# Patient Record
Sex: Female | Born: 1980
Health system: Southern US, Community
[De-identification: ages and names within clinical notes are randomized; demographics above are authoritative.]

## PROBLEM LIST (undated history)

## (undated) DIAGNOSIS — F329 Major depressive disorder, single episode, unspecified: Secondary | ICD-10-CM

## (undated) DIAGNOSIS — F32A Depression, unspecified: Secondary | ICD-10-CM

## (undated) DIAGNOSIS — E119 Type 2 diabetes mellitus without complications: Secondary | ICD-10-CM

## (undated) DIAGNOSIS — F419 Anxiety disorder, unspecified: Secondary | ICD-10-CM

## (undated) DIAGNOSIS — E1169 Type 2 diabetes mellitus with other specified complication: Secondary | ICD-10-CM

## (undated) DIAGNOSIS — D649 Anemia, unspecified: Secondary | ICD-10-CM

## (undated) DIAGNOSIS — E049 Nontoxic goiter, unspecified: Secondary | ICD-10-CM

## (undated) DIAGNOSIS — G629 Polyneuropathy, unspecified: Secondary | ICD-10-CM

## (undated) HISTORY — DX: Type 2 diabetes mellitus with other specified complication: E11.69

## (undated) HISTORY — DX: Anemia, unspecified: D64.9

## (undated) HISTORY — DX: Depression, unspecified: F32.A

## (undated) HISTORY — PX: SALPINGECTOMY: SHX328

## (undated) HISTORY — DX: Major depressive disorder, single episode, unspecified: F32.9

## (undated) HISTORY — DX: Anxiety disorder, unspecified: F41.9

## (undated) HISTORY — PX: OTHER SURGICAL HISTORY: SHX169

## (undated) HISTORY — DX: Type 2 diabetes mellitus without complications: E11.9

## (undated) HISTORY — DX: Polyneuropathy, unspecified: G62.9

## (undated) HISTORY — PX: INNER EAR SURGERY: SHX679

---

## 1999-05-27 ENCOUNTER — Other Ambulatory Visit: Admission: RE | Admit: 1999-05-27 | Discharge: 1999-05-27 | Payer: Self-pay | Admitting: Internal Medicine

## 1999-12-12 ENCOUNTER — Emergency Department (HOSPITAL_COMMUNITY): Admission: EM | Admit: 1999-12-12 | Discharge: 1999-12-12 | Payer: Self-pay | Admitting: Emergency Medicine

## 2001-01-22 ENCOUNTER — Emergency Department (HOSPITAL_COMMUNITY): Admission: EM | Admit: 2001-01-22 | Discharge: 2001-01-22 | Payer: Self-pay | Admitting: Emergency Medicine

## 2001-11-05 ENCOUNTER — Other Ambulatory Visit: Admission: RE | Admit: 2001-11-05 | Discharge: 2001-11-05 | Payer: Self-pay | Admitting: Gynecology

## 2003-09-29 ENCOUNTER — Other Ambulatory Visit: Admission: RE | Admit: 2003-09-29 | Discharge: 2003-09-29 | Payer: Self-pay | Admitting: Gynecology

## 2004-10-08 ENCOUNTER — Other Ambulatory Visit: Admission: RE | Admit: 2004-10-08 | Discharge: 2004-10-08 | Payer: Self-pay | Admitting: Gynecology

## 2009-01-02 ENCOUNTER — Emergency Department (HOSPITAL_COMMUNITY): Admission: EM | Admit: 2009-01-02 | Discharge: 2009-01-02 | Payer: Self-pay | Admitting: Emergency Medicine

## 2009-01-15 ENCOUNTER — Inpatient Hospital Stay (HOSPITAL_COMMUNITY): Admission: AD | Admit: 2009-01-15 | Discharge: 2009-01-15 | Payer: Self-pay | Admitting: Family Medicine

## 2009-12-03 ENCOUNTER — Inpatient Hospital Stay (HOSPITAL_COMMUNITY): Admission: RE | Admit: 2009-12-03 | Discharge: 2009-12-03 | Payer: Self-pay | Admitting: Obstetrics & Gynecology

## 2009-12-03 ENCOUNTER — Ambulatory Visit: Payer: Self-pay | Admitting: Obstetrics and Gynecology

## 2009-12-21 ENCOUNTER — Inpatient Hospital Stay (HOSPITAL_COMMUNITY): Admission: AD | Admit: 2009-12-21 | Discharge: 2009-12-22 | Payer: Self-pay | Admitting: Obstetrics & Gynecology

## 2009-12-21 ENCOUNTER — Ambulatory Visit: Payer: Self-pay | Admitting: Nurse Practitioner

## 2010-01-02 ENCOUNTER — Other Ambulatory Visit: Admission: RE | Admit: 2010-01-02 | Discharge: 2010-01-02 | Payer: Self-pay | Admitting: Obstetrics and Gynecology

## 2010-01-02 ENCOUNTER — Ambulatory Visit: Payer: Self-pay | Admitting: Obstetrics and Gynecology

## 2010-01-03 ENCOUNTER — Encounter (INDEPENDENT_AMBULATORY_CARE_PROVIDER_SITE_OTHER): Payer: Self-pay | Admitting: *Deleted

## 2010-01-03 LAB — CONVERTED CEMR LAB: Hepatitis B Surface Ag: NEGATIVE

## 2010-01-16 ENCOUNTER — Ambulatory Visit: Payer: Self-pay | Admitting: Obstetrics and Gynecology

## 2010-02-06 ENCOUNTER — Ambulatory Visit: Payer: Self-pay | Admitting: Obstetrics & Gynecology

## 2010-02-06 LAB — CONVERTED CEMR LAB: TSH: 2.228 microintl units/mL (ref 0.350–4.500)

## 2010-02-27 ENCOUNTER — Inpatient Hospital Stay (HOSPITAL_COMMUNITY)
Admission: RE | Admit: 2010-02-27 | Discharge: 2010-03-01 | Payer: Self-pay | Source: Home / Self Care | Attending: Obstetrics & Gynecology | Admitting: Obstetrics & Gynecology

## 2010-03-20 ENCOUNTER — Ambulatory Visit
Admission: RE | Admit: 2010-03-20 | Discharge: 2010-03-20 | Payer: Self-pay | Source: Home / Self Care | Attending: Obstetrics and Gynecology | Admitting: Obstetrics and Gynecology

## 2010-04-04 ENCOUNTER — Ambulatory Visit
Admission: RE | Admit: 2010-04-04 | Discharge: 2010-04-04 | Payer: Self-pay | Source: Home / Self Care | Attending: Obstetrics & Gynecology | Admitting: Obstetrics & Gynecology

## 2010-05-20 LAB — CBC
Hemoglobin: 8.1 g/dL — ABNORMAL LOW (ref 12.0–15.0)
MCH: 21.9 pg — ABNORMAL LOW (ref 26.0–34.0)
MCV: 69.7 fL — ABNORMAL LOW (ref 78.0–100.0)
RBC: 3.7 MIL/uL — ABNORMAL LOW (ref 3.87–5.11)
WBC: 9.6 10*3/uL (ref 4.0–10.5)

## 2010-05-21 LAB — CBC
Hemoglobin: 10 g/dL — ABNORMAL LOW (ref 12.0–15.0)
MCH: 23.3 pg — ABNORMAL LOW (ref 26.0–34.0)
RBC: 4.27 MIL/uL (ref 3.87–5.11)
WBC: 5.6 10*3/uL (ref 4.0–10.5)

## 2010-05-21 LAB — SURGICAL PCR SCREEN: Staphylococcus aureus: NEGATIVE

## 2010-05-22 LAB — POCT PREGNANCY, URINE: Preg Test, Ur: NEGATIVE

## 2010-05-23 LAB — URINALYSIS, ROUTINE W REFLEX MICROSCOPIC
Glucose, UA: NEGATIVE mg/dL
Ketones, ur: NEGATIVE mg/dL
Leukocytes, UA: NEGATIVE
Nitrite: NEGATIVE
Specific Gravity, Urine: >1.03 — ABNORMAL HIGH (ref 1.005–1.030)
pH: 5.5 (ref 5.0–8.0)

## 2010-05-23 LAB — CBC
HCT: 31.7 % — ABNORMAL LOW (ref 36.0–46.0)
Hemoglobin: 9.9 g/dL — ABNORMAL LOW (ref 12.0–15.0)
Hemoglobin: 10.7 g/dL — ABNORMAL LOW (ref 12.0–15.0)
MCH: 27.8 pg (ref 26.0–34.0)
MCV: 85.4 fL (ref 78.0–100.0)
Platelets: 252 10*3/uL (ref 150–400)
RBC: 3.58 MIL/uL — ABNORMAL LOW (ref 3.87–5.11)
RBC: 3.71 MIL/uL — ABNORMAL LOW (ref 3.87–5.11)
RDW: 15.9 % — ABNORMAL HIGH (ref 11.5–15.5)
WBC: 6.8 10*3/uL (ref 4.0–10.5)
WBC: 7.2 10*3/uL (ref 4.0–10.5)

## 2010-05-23 LAB — URINE MICROSCOPIC-ADD ON

## 2010-05-23 LAB — POCT PREGNANCY, URINE: Preg Test, Ur: NEGATIVE

## 2010-05-23 LAB — GC/CHLAMYDIA PROBE AMP, GENITAL: Chlamydia, DNA Probe: NEGATIVE

## 2010-05-24 NOTE — Progress Notes (Signed)
Wanda [name], BLUMBERG          ACCOUNT NO.:  [id number]  MEDICAL RECORD NO.:  [id number]           PATIENT TYPE:  LOCATION:  [facility]                     FACILITY:  PHYSICIAN:  Allie Bossier, MD        DATE OF BIRTH:  [birth year]  DATE OF SERVICE:  04/04/2010                                 CLINIC NOTE  [name] is a 30 year old lady who is now 4-1/2 week status post exploratory laparotomy and right salpingo-oophorectomy for right hydrosalpinx and a 9 cm right dermoid.  The pathology has come back benign dermoid and hydrosalpinx was noted.  On exam, the patient has no complaints.  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  She has not yet had intercourse as she is currently abstinent.  Her bowel and bladder habits have returned to normal.  On exam, her incision is very well-healed and her abdomen is benign.  ASSESSMENT AND PLAN:  Postop doing well.  She will come back in November for her Pap smears and annual exam or sooner as necessary.     Allie Bossier, MD    MCD/MEDQ  D:  04/04/2010  T:  04/05/2010  Job:  [id number]

## 2010-06-06 ENCOUNTER — Ambulatory Visit: Payer: Self-pay

## 2010-06-06 DIAGNOSIS — N938 Other specified abnormal uterine and vaginal bleeding: Secondary | ICD-10-CM

## 2010-06-06 DIAGNOSIS — N949 Unspecified condition associated with female genital organs and menstrual cycle: Secondary | ICD-10-CM

## 2010-06-12 LAB — URINALYSIS, ROUTINE W REFLEX MICROSCOPIC
Bilirubin Urine: NEGATIVE
Nitrite: NEGATIVE
Specific Gravity, Urine: 1.02 (ref 1.005–1.030)
Urobilinogen, UA: 0.2 mg/dL (ref 0.0–1.0)
pH: 5 (ref 5.0–8.0)

## 2010-06-12 LAB — POCT PREGNANCY, URINE: Preg Test, Ur: NEGATIVE

## 2010-06-12 LAB — CBC
Hemoglobin: 12 g/dL (ref 12.0–15.0)
MCHC: 32.7 g/dL (ref 30.0–36.0)
RBC: 4.45 MIL/uL (ref 3.87–5.11)
RDW: 17.2 % — ABNORMAL HIGH (ref 11.5–15.5)

## 2010-06-12 LAB — URINE MICROSCOPIC-ADD ON

## 2010-06-13 LAB — MONONUCLEOSIS SCREEN: Mono Screen: NEGATIVE

## 2010-06-13 LAB — RAPID STREP SCREEN (MED CTR MEBANE ONLY): Streptococcus, Group A Screen (Direct): NEGATIVE

## 2010-08-07 ENCOUNTER — Other Ambulatory Visit: Payer: Self-pay | Admitting: Family Medicine

## 2010-08-07 ENCOUNTER — Ambulatory Visit (INDEPENDENT_AMBULATORY_CARE_PROVIDER_SITE_OTHER): Payer: Self-pay | Admitting: Family Medicine

## 2010-08-07 DIAGNOSIS — N899 Noninflammatory disorder of vagina, unspecified: Secondary | ICD-10-CM

## 2010-08-07 DIAGNOSIS — D219 Benign neoplasm of connective and other soft tissue, unspecified: Secondary | ICD-10-CM

## 2010-08-07 DIAGNOSIS — D259 Leiomyoma of uterus, unspecified: Secondary | ICD-10-CM

## 2010-08-07 DIAGNOSIS — R81 Glycosuria: Secondary | ICD-10-CM

## 2010-08-07 LAB — POCT URINALYSIS DIP (DEVICE)
Glucose, UA: >=1000 mg/dL — AB
Protein, ur: NEGATIVE mg/dL
Urobilinogen, UA: 1 mg/dL (ref 0.0–1.0)

## 2010-08-08 NOTE — Group Therapy Note (Signed)
Wanda [name], RIESER          ACCOUNT NO.:  [id number]  MEDICAL RECORD NO.:  [id number]           PATIENT TYPE:  A  LOCATION:  [facility]                   FACILITY:  [facility]  PHYSICIAN:  Lucina Mellow, DO   DATE OF BIRTH:  1980-12-12  DATE OF SERVICE:  08/07/2010                                 CLINIC NOTE  PRESENTING COMPLAINT:  Followup.  HISTORY OF PRESENT ILLNESS:  The patient is a 30 year old gravida 0 who presents for followup after having a 9-cm right dermoid cyst removed in December 2011.  The patient states she is doing well.  She has been on Depo-Provera since the surgery and states that about the week or so before her injection is due, she starts to have a little bit of bleeding.  She states the bleeding is nowhere near as heavy as it was prior to the surgery, but she is currently getting a normal period.  She states that she does not soak her pads and does not feel like she has to wear tampons and pads, and has a very minimal if any pain at all.  She does, however, also admits today that she is having some vaginal irritation especially when she urinates.  She states that she was using [place] Spring soap __________ that [place] would be better, and she has been cleansing her area with that as well as using Vagisil with some relief of her symptoms.  She would like to do urinalysis today as well as a pelvic exam to rule out infection.  Past surgical history includes laparotomy with right dermoid cyst removal and lysis of adhesions in December 2011 and repair of her right eardrum.  MEDICATIONS:  Depo-Provera.  MEDICAL HISTORY: 1. Obesity. 2. Hypertension. 3. Fibroid and dermoid cyst. 4. Dysfunctional uterine bleeding.  PHYSICAL EXAMINATION:  VITAL SIGNS:  Today, the patient's blood pressure is 137/89, pulse of 86, temperature of 98.4, weight of 195.6, height of 62.5 inches. GENERAL:  The patient is a pleasant African American female who looks her stated age of 30  years old. HEART:  Regular rate and rhythm. LUNGS:  Clear to auscultation bilaterally.  Diarrhea is nonpalpable. PELVIC:  Externally, she has normal-appearing external female genitalia with some mild erythema on the lips of the labia internal.  Speculum exam shows nice pink vaginal mucosa with appropriate amount of moisture and rugae.  Bleeding is noted in the vaginal vault coming from the cervical os.  Gonorrhea, Chlamydia, DNA swab was obtained as well as a wet prep.  Bimanual exam reveals no masses on the right.  Left ovary is palpated and is small, benign, and uterus feels about to be 12-weeks size.  ASSESSMENT: 1. Uterine fibroids.  We will get a followup ultrasound to evaluate     this as a possible cause of her continued bleeding despite Depo-     Provera; and if it is enlarging, consider other options for the     patient.  The patient desires to have a child and the fibroid may     be anatomically preventing this, but she is also on Depo-Provera at     this time for her bleeding.  The ultrasound will  also serve as a     followup on the right hydrosalpinx, which was surgically removed. 2. External vaginitis, seems to be contact dermatitis.  We discussed     using a much milder soap such as Caress or Dove instead of [place] or     even just warm water for at least a few days until the irritation     and inflammation resolves a bit.  I will wait for the cultures to     come back and the wet prep to come back before I give her any kind     of treatment, but she will be notified of treatment. 3. Urinalysis shows glycosuria with greater than a 1000 glucose.  The     patient states she has no history of diabetes, but she has been     eating up some sugar and candies all day long.  We will obtain a     hemoglobin A1c, chemistry panel, and thyroid panel today on her to     assess her for any diabetes and to start treatment on her as     indicated.  The patient overall will follow up after  ultrasound and     the laboratory results are back.  If anything is abnormal prior to     when I see her, I will give her a call.  The patient voices     understanding and agrees with plans.          ______________________________ Lucina Mellow, DO    SH/MEDQ  D:  08/07/2010  T:  08/08/2010  Job:  [id number]

## 2010-08-13 ENCOUNTER — Other Ambulatory Visit (HOSPITAL_COMMUNITY): Payer: Self-pay

## 2010-08-13 ENCOUNTER — Ambulatory Visit (HOSPITAL_COMMUNITY)
Admission: RE | Admit: 2010-08-13 | Discharge: 2010-08-13 | Disposition: A | Discharge status: Home / Self Care | Payer: Self-pay | Source: Ambulatory Visit | Attending: Family Medicine | Admitting: Family Medicine

## 2010-08-13 DIAGNOSIS — D252 Subserosal leiomyoma of uterus: Secondary | ICD-10-CM | POA: Insufficient documentation

## 2010-08-13 DIAGNOSIS — N7013 Chronic salpingitis and oophoritis: Secondary | ICD-10-CM | POA: Insufficient documentation

## 2010-08-13 DIAGNOSIS — D219 Benign neoplasm of connective and other soft tissue, unspecified: Secondary | ICD-10-CM

## 2010-08-26 ENCOUNTER — Ambulatory Visit: Payer: Self-pay | Admitting: Family Medicine

## 2010-08-26 ENCOUNTER — Ambulatory Visit: Payer: Self-pay

## 2010-08-26 DIAGNOSIS — N7013 Chronic salpingitis and oophoritis: Secondary | ICD-10-CM

## 2010-08-26 DIAGNOSIS — E119 Type 2 diabetes mellitus without complications: Secondary | ICD-10-CM

## 2010-08-26 DIAGNOSIS — D259 Leiomyoma of uterus, unspecified: Secondary | ICD-10-CM

## 2010-08-27 NOTE — Group Therapy Note (Unsigned)
Wanda [name], Wanda [name]          ACCOUNT NO.:  [id number]  MEDICAL RECORD NO.:  [id number]           PATIENT TYPE:  A  LOCATION[facility]  [facility]                   FACILITY:  [facility]  PHYSICIAN:  Lucina Mellow, DO   DATE OF BIRTH:  12/28/1980  DATE OF SERVICE:  08/26/2010                                 CLINIC NOTE  The patient presents today for results of her ultrasound and also for a Depo-Provera shot.  The patient states that she never took her medication for Trichomonas which is provided for her on January 02, 2010, had a wet prep on Aug 07, 2010, that was negative for Trichomonas. She is concerned because she continues to have brownish vaginal discharge and also continues to have irritation.  The patient was seen by me on Aug 07, 2010, who was presenting for followup after having a right centimeter dermoid cyst removed in 2011.  At that time, she states she was doing well, but was having some irritating vaginal discharge, at which time I performed STD testing which the information is shared with her today.  The patient also was having urinary symptoms and so urinalysis was done at that visit and was found that she had over a 1000 of glucose in her urine.  Further lab testing was ordered to evaluate her for possibility of diabetes mellitus and this information is also shared with her today.  MEDICATIONS:  Include Depo-Provera.  MEDICAL HISTORY:  Obesity, hypertension, fibroid and dermoid cyst, and dysfunctional uterine bleeding.  PHYSICAL EXAMINATION:  VITAL SIGNS:  The patient weighs 200 pounds, height 62.5 inches, blood pressure 144/94, pulse of 82, temperature of 97.6. GENERAL:  The patient is very pleasant African American female who looks younger than her stated age of 30 years old. HEART:  Regular rate and rhythm. LUNGS:  Clear to auscultation bilaterally. PELVIC:  She is noted to have normal-appearing external female genitalia with no external irritation appreciated.   Speculum exam shows nice pink vaginal mucosa with appropriate amount of moisture and rugae.  Some dark brown blood is noted in the vaginal vault and coming from the cervical os.  This is wiped away with Fox swabs and continued to come from the cervical os.  LABORATORY DATA:  Lab tests this year with the patient today include the urinary culture from which we cultured her showed Klebsiella pneumoniae. She has already provided a prescription for ciprofloxacin.  Gonorrhea and Chlamydia were both negative.  A chemistry panel showed that she had a glucose of 230, otherwise was normal.  TSH was 2.219, which was normal.  Hemoglobin A1c was 9.2 elevated which gives an average glucose of 217.  A wet prep was negative for yeast, Trichomonas, clue cells, and white cells.  ASSESSMENT: 1. Diabetes mellitus type 2 information provided to the patient     regarding her labs is consistent with diabetes type 2.  We     discussed lifestyle changes that she can undertake such as weight     loss, exercise, and improvement of her diet which would help     control her blood sugar.  We discussed her eating an entire bag of     gummy  worms prior to the urinalysis could have been what caused the     glucose in the urine at that time, however, she does continue to     have a diagnosis of diabetes based on elevated glucose when blood     drawn as well as elevated hemoglobin A1c.  We discussed options for     care.  She opted to start metformin with me today, a prescription     is provided for 500 mg daily.  A referral information slip was     provided to her to be seen at [facility] for primary care     for her diabetes.  I discussed with her that because of the     diagnosis of diabetes, she needs to be screened closely for     cholesterol, for her eye exam, and for renal function on a regular     basis and to get her diabetes under control.  She can return to the     clinic or call me with questions  until she can get into clinic. 2. The uterine fibroids as well as the left hydrosalpinx which was     seen on ultrasound.  Again this result is shared with her.  It     shows that there is a left hydrosalpinx and a fibroid uterus with a     myometrial fibroid that is increased in size, now measuring 3.0 x     2.3 x 2.1 cm.  There is also a 2.4-cm pedunculated subserosal     fibroid at the fundus.  This information is shared with the patient     and discussion now should occur with a surgeon to see what her     options are surgically, if she needs them.  She continues to take     the Depo-Provera for bleeding, and although she would like to get     pregnant today, she opts to continue with the Depo-Provera to     control for her bleeding.  She has not had any children, has never     been pregnant.  She really desires to do so.  Again because of this     desire, I feel that she needs to be seen by one of our surgeons     since they can discuss if there is any surgical options for her to     assist with her getting pregnant.  I also discussed with her     perhaps it was undiagnosed untreated diabetes mellitus which has     been preventing her from getting pregnant as well as been causing     her to have abnormal bleeding and ovulation cycles.  The patient's     questions were answered.  She voices understanding and agrees with     the plans.          ______________________________ Lucina Mellow, DO    SH/MEDQ  D:  08/26/2010  T:  08/27/2010  Job:  [id number]

## 2010-09-07 DIAGNOSIS — E669 Obesity, unspecified: Secondary | ICD-10-CM | POA: Insufficient documentation

## 2010-09-07 DIAGNOSIS — I10 Essential (primary) hypertension: Secondary | ICD-10-CM | POA: Insufficient documentation

## 2010-09-07 DIAGNOSIS — E119 Type 2 diabetes mellitus without complications: Secondary | ICD-10-CM | POA: Insufficient documentation

## 2010-09-07 DIAGNOSIS — N938 Other specified abnormal uterine and vaginal bleeding: Secondary | ICD-10-CM | POA: Insufficient documentation

## 2010-09-07 DIAGNOSIS — N7011 Chronic salpingitis: Secondary | ICD-10-CM | POA: Insufficient documentation

## 2010-09-07 DIAGNOSIS — D219 Benign neoplasm of connective and other soft tissue, unspecified: Secondary | ICD-10-CM | POA: Insufficient documentation

## 2010-09-07 DIAGNOSIS — N83209 Unspecified ovarian cyst, unspecified side: Secondary | ICD-10-CM | POA: Insufficient documentation

## 2010-09-25 ENCOUNTER — Ambulatory Visit: Payer: Self-pay | Admitting: Obstetrics & Gynecology

## 2010-10-14 ENCOUNTER — Ambulatory Visit: Payer: Self-pay | Admitting: Obstetrics and Gynecology

## 2010-11-12 ENCOUNTER — Ambulatory Visit: Payer: Self-pay

## 2010-11-15 ENCOUNTER — Ambulatory Visit: Payer: Self-pay

## 2010-11-22 ENCOUNTER — Encounter: Payer: Self-pay | Admitting: Obstetrics and Gynecology

## 2010-11-22 ENCOUNTER — Ambulatory Visit (INDEPENDENT_AMBULATORY_CARE_PROVIDER_SITE_OTHER): Payer: Self-pay

## 2010-11-22 VITALS — BP 136/84 | HR 69

## 2010-11-22 DIAGNOSIS — IMO0001 Reserved for inherently not codable concepts without codable children: Secondary | ICD-10-CM

## 2010-11-22 DIAGNOSIS — Z309 Encounter for contraceptive management, unspecified: Secondary | ICD-10-CM

## 2010-11-22 MED ORDER — MEDROXYPROGESTERONE ACETATE 150 MG/ML IM SUSP
150.0000 mg | INTRAMUSCULAR | Status: AC
  Administered 2010-11-22 – 2011-02-07 (×2): 150 mg via INTRAMUSCULAR

## 2011-02-07 ENCOUNTER — Encounter: Payer: Self-pay | Admitting: Obstetrics & Gynecology

## 2011-02-07 ENCOUNTER — Ambulatory Visit (INDEPENDENT_AMBULATORY_CARE_PROVIDER_SITE_OTHER): Payer: Self-pay | Admitting: *Deleted

## 2011-02-07 DIAGNOSIS — D219 Benign neoplasm of connective and other soft tissue, unspecified: Secondary | ICD-10-CM

## 2011-02-07 DIAGNOSIS — D259 Leiomyoma of uterus, unspecified: Secondary | ICD-10-CM

## 2011-02-07 DIAGNOSIS — Z3049 Encounter for surveillance of other contraceptives: Secondary | ICD-10-CM

## 2011-02-07 DIAGNOSIS — N949 Unspecified condition associated with female genital organs and menstrual cycle: Secondary | ICD-10-CM

## 2011-02-07 DIAGNOSIS — N938 Other specified abnormal uterine and vaginal bleeding: Secondary | ICD-10-CM

## 2011-05-02 ENCOUNTER — Ambulatory Visit: Payer: Self-pay

## 2011-10-14 ENCOUNTER — Encounter (HOSPITAL_COMMUNITY): Payer: Self-pay | Admitting: *Deleted

## 2011-10-14 ENCOUNTER — Emergency Department (HOSPITAL_COMMUNITY)
Admission: EM | Admit: 2011-10-14 | Discharge: 2011-10-14 | Disposition: A | Discharge status: Home / Self Care | Payer: Self-pay | Attending: Emergency Medicine | Admitting: Emergency Medicine

## 2011-10-14 DIAGNOSIS — Y93G9 Activity, other involving cooking and grilling: Secondary | ICD-10-CM | POA: Insufficient documentation

## 2011-10-14 DIAGNOSIS — T2101XA Burn of unspecified degree of chest wall, initial encounter: Secondary | ICD-10-CM

## 2011-10-14 DIAGNOSIS — T2121XA Burn of second degree of chest wall, initial encounter: Secondary | ICD-10-CM | POA: Insufficient documentation

## 2011-10-14 DIAGNOSIS — Y998 Other external cause status: Secondary | ICD-10-CM | POA: Insufficient documentation

## 2011-10-14 DIAGNOSIS — X19XXXA Contact with other heat and hot substances, initial encounter: Secondary | ICD-10-CM | POA: Insufficient documentation

## 2011-10-14 DIAGNOSIS — T310 Burns involving less than 10% of body surface: Secondary | ICD-10-CM | POA: Insufficient documentation

## 2011-10-14 DIAGNOSIS — E119 Type 2 diabetes mellitus without complications: Secondary | ICD-10-CM | POA: Insufficient documentation

## 2011-10-14 DIAGNOSIS — T2000XA Burn of unspecified degree of head, face, and neck, unspecified site, initial encounter: Secondary | ICD-10-CM | POA: Insufficient documentation

## 2011-10-14 MED ORDER — CEPHALEXIN 500 MG PO CAPS: 500.0000 mg | ORAL_CAPSULE | Freq: Four times a day (QID) | ORAL | Status: AC

## 2011-10-14 MED ORDER — SILVER SULFADIAZINE 1 % EX CREA
TOPICAL_CREAM | Freq: Two times a day (BID) | CUTANEOUS | Status: DC
  Administered 2011-10-14: 14:00:00 via TOPICAL
  Filled 2011-10-14: qty 50
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
## 2011-10-16 NOTE — ED Provider Notes (Signed)
Medical screening examination/treatment/procedure(s) were performed by non-physician practitioner and as supervising physician I was immediately available for consultation/collaboration.  Toy Baker, MD 10/16/11 [phone]

## 2013-01-05 ENCOUNTER — Other Ambulatory Visit (HOSPITAL_COMMUNITY): Payer: Self-pay | Admitting: Obstetrics & Gynecology

## 2013-01-05 DIAGNOSIS — N970 Female infertility associated with anovulation: Secondary | ICD-10-CM

## 2013-01-06 ENCOUNTER — Ambulatory Visit (HOSPITAL_COMMUNITY)
Admission: RE | Admit: 2013-01-06 | Discharge: 2013-01-06 | Disposition: A | Discharge status: Home / Self Care | Payer: BC Managed Care – PPO | Source: Ambulatory Visit | Attending: Obstetrics & Gynecology | Admitting: Obstetrics & Gynecology

## 2013-01-06 DIAGNOSIS — N979 Female infertility, unspecified: Secondary | ICD-10-CM | POA: Insufficient documentation

## 2013-01-06 DIAGNOSIS — N7013 Chronic salpingitis and oophoritis: Secondary | ICD-10-CM | POA: Insufficient documentation

## 2013-01-06 DIAGNOSIS — N970 Female infertility associated with anovulation: Secondary | ICD-10-CM

## 2013-01-06 MED ORDER — IOHEXOL 300 MG/ML  SOLN
20.0000 mL | Freq: Once | INTRAMUSCULAR | Status: AC | PRN
  Administered 2013-01-06: 10 mL

## 2014-05-23 ENCOUNTER — Ambulatory Visit (INDEPENDENT_AMBULATORY_CARE_PROVIDER_SITE_OTHER): Payer: Managed Care, Other (non HMO) | Admitting: Family Medicine

## 2014-05-23 VITALS — BP 134/94 | HR 87 | Temp 98.1°F | Resp 16 | Ht 63.0 in | Wt 182.4 lb

## 2014-05-23 DIAGNOSIS — F329 Major depressive disorder, single episode, unspecified: Secondary | ICD-10-CM | POA: Diagnosis not present

## 2014-05-23 DIAGNOSIS — E049 Nontoxic goiter, unspecified: Secondary | ICD-10-CM

## 2014-05-23 DIAGNOSIS — E119 Type 2 diabetes mellitus without complications: Secondary | ICD-10-CM | POA: Diagnosis not present

## 2014-05-23 DIAGNOSIS — F32A Depression, unspecified: Secondary | ICD-10-CM

## 2014-05-23 LAB — GLUCOSE, POCT (MANUAL RESULT ENTRY): POC GLUCOSE: 250 mg/dL — AB (ref 70–99)

## 2014-05-23 MED ORDER — FLUOXETINE HCL 20 MG PO TABS: 20.0000 mg | ORAL_TABLET | Freq: Every day | ORAL | Status: DC

## 2014-05-23 MED ORDER — LISINOPRIL 2.5 MG PO TABS: 2.5000 mg | ORAL_TABLET | Freq: Every day | ORAL | Status: DC

## 2014-05-23 MED ORDER — ATORVASTATIN CALCIUM 20 MG PO TABS: 20.0000 mg | ORAL_TABLET | Freq: Every day | ORAL | Status: DC

## 2014-05-23 MED ORDER — METFORMIN HCL 500 MG PO TABS: 500.0000 mg | ORAL_TABLET | Freq: Two times a day (BID) | ORAL | Status: DC

## 2014-05-23 NOTE — Progress Notes (Addendum)
Subjective:  This chart was scribed for Wanda Ray, MD by Wanda [name], Medical Scribe. This patient was seen in Room 2 and the patient's care was started at 8:44 PM.   Patient ID: Wanda [name], female    DOB: March 23, 1980, 34 y.o.   MRN: [id number]  Chief Complaint  Patient presents with  . Other    diabetes check    HPI Wanda [name] is a 34 y.o. female new patient to Dr. Carlota [name] here for DM check.   1. Diabetes Mellitus Currently on metformin 500mg  BID also takes Lisinopril 2.5 mg QD and Lipitor 20 mg QD. Pt takes her metformin medication 4x a week. Pt takes her Lisinopril around the same time that she takes her metformin. Pt was also prescribed Lantus insulin but is unsure of the doses. Pt states she has been off her Lipitor medication. Pt admits she has not been taking her medication like she is supposed to. She does not check her BS at home. Pt admits she is having some depression issues due to her DM diagnosis. She was diagnosed 2 years at [place]. Pt states her last A1C check was March 17, 2014 and she states it was high but is unsure of the number. She states her weight has gone down around 18 pounds. She has changed her diet but is not exercising regularly.  She denies any low BS symptoms with her DM medications.  2. Depression Pt is having some depression issues due to diabetes diagnosis. She states she has been depressed for several years. She has never been on medication for it. She notes that she have never wanted to be on medication due to side effects. Pt notes some mild anhedonia and stress. Pt had surgery 4 years ago and had to have a right oophorectomy and she found out her left uterine tube is blocked so if she were to have kids she would have to do IVF and that has been contributing to her depression. She denies any SI.  PCP: No PCP Per Patient  Patient Active Problem List   Diagnosis Date Noted  . DUB (dysfunctional uterine bleeding)  09/07/2010  . Ovarian cyst 09/07/2010  . Fibroid 09/07/2010  . Obesity 09/07/2010  . Hydrosalpinx 09/07/2010  . Hypertension 09/07/2010  . Type 2 diabetes mellitus 09/07/2010   Past Medical History  Diagnosis Date  . Diabetes mellitus   . Anemia   . Anxiety   . Depression    Past Surgical History  Procedure Laterality Date  . Oophrectomy      right   No Known Allergies Prior to Admission medications   Medication Sig Start Date End Date Taking? Authorizing Provider  atorvastatin (LIPITOR) 20 MG tablet Take 20 mg by mouth daily.   Yes Historical Provider, MD  lisinopril (PRINIVIL,ZESTRIL) 2.5 MG tablet Take 2.5 mg by mouth daily.   Yes Historical Provider, MD  metFORMIN (GLUCOPHAGE) 500 MG tablet Take by mouth 2 (two) times daily with a meal.   Yes Historical Provider, MD   History   Social History  . Marital Status: Single    Spouse Name: N/A  . Number of Children: N/A  . Years of Education: N/A   Occupational History  . Not on file.   Social History Main Topics  . Smoking status: Never Smoker   . Smokeless tobacco: Not on file  . Alcohol Use: Yes     Comment: ocassionally  . Drug Use: No  . Sexual Activity: Yes  Birth Control/ Protection: Injection   Other Topics Concern  . Not on file   Social History Narrative    Review of Systems  Psychiatric/Behavioral: Positive for dysphoric mood. Negative for suicidal ideas. The patient is nervous/anxious.   All other systems reviewed and are negative.      Objective:   Physical Exam  Constitutional: She is oriented to person, place, and time. She appears well-developed and well-nourished. No distress.  HENT:  Head: Normocephalic and atraumatic.  Eyes: Conjunctivae and EOM are normal. Pupils are equal, round, and reactive to light.  Neck: Neck supple. Carotid bruit is not present. No tracheal deviation present. Thyromegaly (R>L but no nodules palpated) present.  Cardiovascular: Normal rate, regular rhythm,  normal heart sounds and intact distal pulses.   Pulmonary/Chest: Effort normal and breath sounds normal. No respiratory distress.  Abdominal: Soft. She exhibits no pulsatile midline mass. There is no tenderness.  Musculoskeletal: Normal range of motion.  Neurological: She is alert and oriented to person, place, and time.  Skin: Skin is warm and dry.  Psychiatric: She has a normal mood and affect. Her behavior is normal.  Nursing note and vitals reviewed.    Filed Vitals:   05/23/14 2036  BP: 134/94  Pulse: 87  Temp: 98.1 F (36.7 C)  TempSrc: Oral  Resp: 16  Height: 5\' 3"  (1.6 m)  Weight: 182 lb 6.4 oz (82.736 kg)  SpO2: 100%   Results for orders placed or performed in visit on 05/23/14  POCT glucose (manual entry)  Result Value Ref Range   POC Glucose 250 (A) 70 - 99 mg/dl        Assessment & [name]:   Wanda [name] is a 34 y.o. female Type 2 diabetes mellitus without complication - [name]: POCT glucose (manual entry), Basic metabolic panel, Ambulatory referral to diabetic education, Hemoglobin A1c, atorvastatin (LIPITOR) 20 MG tablet, lisinopril (PRINIVIL,ZESTRIL) 2.5 MG tablet, metFORMIN (GLUCOPHAGE) 500 MG tablet, CANCELED: POCT glycosylated hemoglobin (Hb A1C)  - nonadherent with metformin, and other meds.  Suspect some of her depression symptoms have impacted this and with dealing with having a diagnosis of diabetes.   -will restart metformin at 500mg  BID, A1c pending, but as nonadherent - may be able to defer insulin for few weeks depending on reading.   -referred to diabetes classes.   -recheck in 2-3 weeks with home readings.   -restart statin, ace inhibitor.   Goiter - [name]: TSH  -history of same prior with reported normal levels. Consider ultrasound after TSH to further characterize.   Depression - [name]: FLUoxetine (PROZAC) 20 MG tablet  -recurrent by hx.  Anhedonia may also be affecting med compliance as above.   -start prozac 20mg  QD for now. SED.   -  counseled on diagnosis and treatment of depression, including CBT/counseling - phone numbers provided below, or can check into work's EAP if needed.   -RTC/ER precations discussed including if any suicidal thoughts. understanding expressed.   -will complete FMLA ppwk for upcoming appts, anticipate every 2 weeks for now until DM under better control and for depression assessment.    Meds ordered this encounter  . FLUoxetine (PROZAC) 20 MG tablet    Sig: Take 1 tablet (20 mg total) by mouth daily.    Dispense:  30 tablet    Refill:  1  . atorvastatin (LIPITOR) 20 MG tablet    Sig: Take 1 tablet (20 mg total) by mouth daily at 6 PM.    Dispense:  30 tablet  Refill:  1  . lisinopril (PRINIVIL,ZESTRIL) 2.5 MG tablet    Sig: Take 1 tablet (2.5 mg total) by mouth daily.    Dispense:  30 tablet    Refill:  1  . metFORMIN (GLUCOPHAGE) 500 MG tablet    Sig: Take 1 tablet (500 mg total) by mouth 2 (two) times daily with a meal.    Dispense:  60 tablet    Refill:  1   Patient Instructions  Work with your work's EAP for a counselor or call one of the names below to start counseling. Start Prozac once per day. Follow up with me in next 2-3 weeks to discuss this further. You should receive a call or letter about your lab results within the next week to 10 days.  Restart metformin twice per day. Once I receive your other labs - I will let you know about Lantus insulin and dosing.  Restart lisinopril once per day.  Walking or other exercise with goal of 150 minutes per week.  I will refer you to diabetes classes. Check your blood sugar once every day if possible at different times (fasting, 2 hours after meals, or bedtime).  Bring record of these to your next office visit.  Return to the clinic or go to the nearest emergency room if any of your symptoms worsen or new symptoms occur.  Vivia Budge: [phone] Arvil [place]aco: [phone]    I personally performed the services described in this  documentation, which was scribed in my presence. The recorded information has been reviewed and considered, and addended by me as needed.

## 2014-05-23 NOTE — Patient Instructions (Addendum)
Work with your work's EAP for a counselor or call one of the names below to start counseling. Start Prozac once per day. Follow up with me in next 2-3 weeks to discuss this further. You should receive a call or letter about your lab results within the next week to 10 days.  Restart metformin twice per day. Once I receive your other labs - I will let you know about Lantus insulin and dosing.  Restart lisinopril once per day.  Walking or other exercise with goal of 150 minutes per week.  I will refer you to diabetes classes. Check your blood sugar once every day if possible at different times (fasting, 2 hours after meals, or bedtime).  Bring record of these to your next office visit.  Return to the clinic or go to the nearest emergency room if any of your symptoms worsen or new symptoms occur.  Vivia Budge: [place]: [phone]

## 2014-05-24 ENCOUNTER — Telehealth: Payer: Self-pay

## 2014-05-24 NOTE — Telephone Encounter (Signed)
Pt dropped off FMAL ppw off for Dr. Carlota Raspberry to complete in 5-7 business days. Blank copies scanned into release placed "on hold", upon completion, please return to disability department upon compltion. Jasmine or myself will scan into release and call pt for pick up.  _ pt is requesting fmla dor 05/23/14 -03/10/15 for health condition

## 2014-05-25 LAB — BASIC METABOLIC PANEL
BUN/Creatinine Ratio: 18 (ref 8–20)
BUN: 10 mg/dL (ref 6–20)
CO2: 19 mmol/L (ref 18–29)
Calcium: 9.4 mg/dL (ref 8.7–10.2)
Chloride: 98 mmol/L (ref 97–108)
Creatinine, Ser: 0.57 mg/dL (ref 0.57–1.00)
GFR calc Af Amer: 139 mL/min/{1.73_m2} (ref >59)
GFR, EST NON AFRICAN AMERICAN: 120 mL/min/{1.73_m2} (ref >59)
Glucose: 249 mg/dL — ABNORMAL HIGH (ref 65–99)
POTASSIUM: 3.8 mmol/L (ref 3.5–5.2)
SODIUM: 135 mmol/L (ref 134–144)

## 2014-05-25 LAB — HEMOGLOBIN A1C
Est. average glucose Bld gHb Est-mCnc: 292 mg/dL
Hgb A1c MFr Bld: 11.8 % — ABNORMAL HIGH (ref 4.8–5.6)

## 2014-05-25 LAB — TSH: TSH: 2.2 u[IU]/mL (ref 0.450–4.500)

## 2014-05-30 NOTE — Telephone Encounter (Signed)
Done and placed in FMLA box.

## 2014-05-31 NOTE — Telephone Encounter (Signed)
Received, scanned into patient chart, and faxed to [employer] at [phone]

## 2014-10-18 IMAGING — RF DG HYSTEROGRAM
8 series · 8 of 8 positions shown · IV contrast (omnipaque)
Comparison: Ultrasound 08/13/2010

FLUOROSCOPY TIME:  1 min, 18 seconds

CLINICAL DATA: Infertility. Prior removal of right fallopian tube.

EXAM:
HYSTEROSALPINGOGRAM
TECHNIQUE: Following cleansing of the cervix and vagina with Betadine solution,
a hysterosalpingogram was performed using a 5-French
hysterosalpingogram catheter and Omnipaque 300 contrast. The patient
tolerated the examination without difficulty.

[Series 1: run · 1 of 1 slices shown (1 of 8)]
[im 1/1]
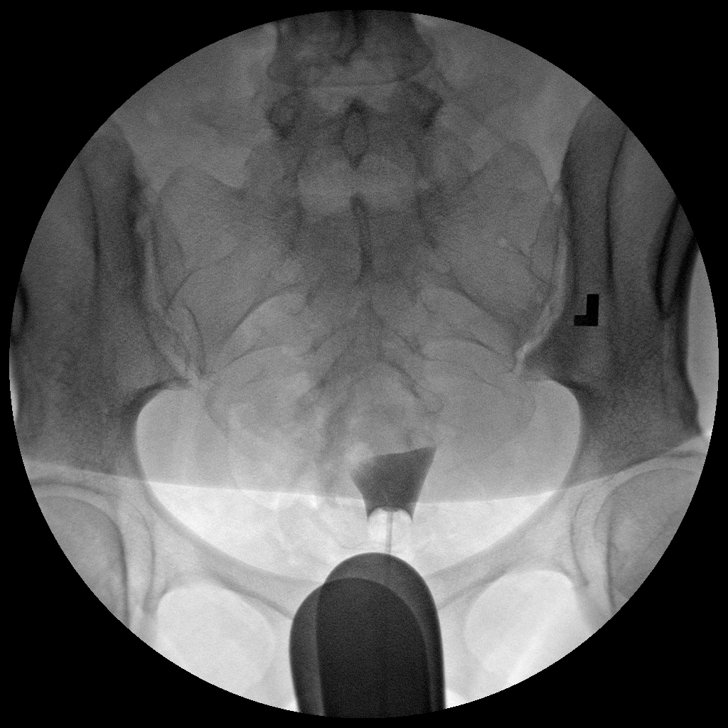

[Series 2: run · 1 of 1 slices shown (2 of 8)]
[im 1/1]
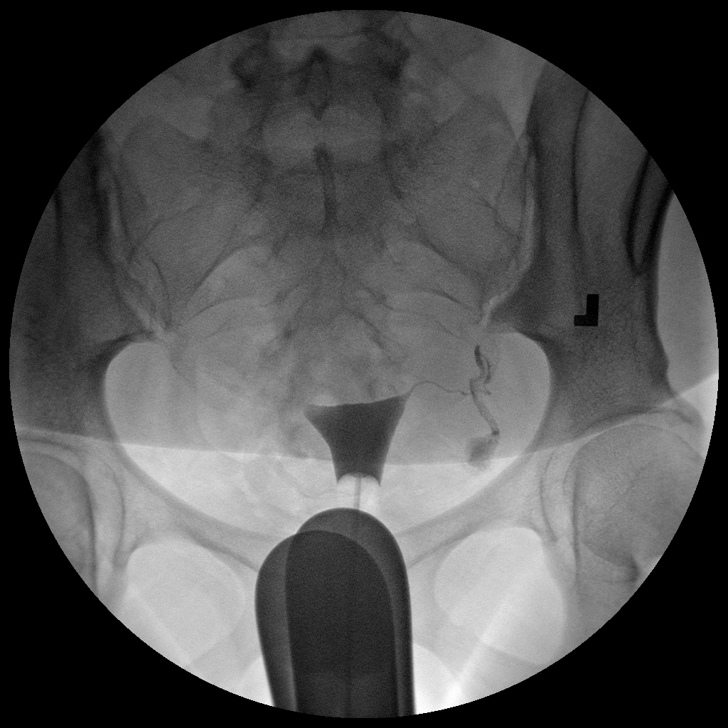

[Series 3: run · 1 of 1 slices shown (3 of 8)]
[im 1/1]
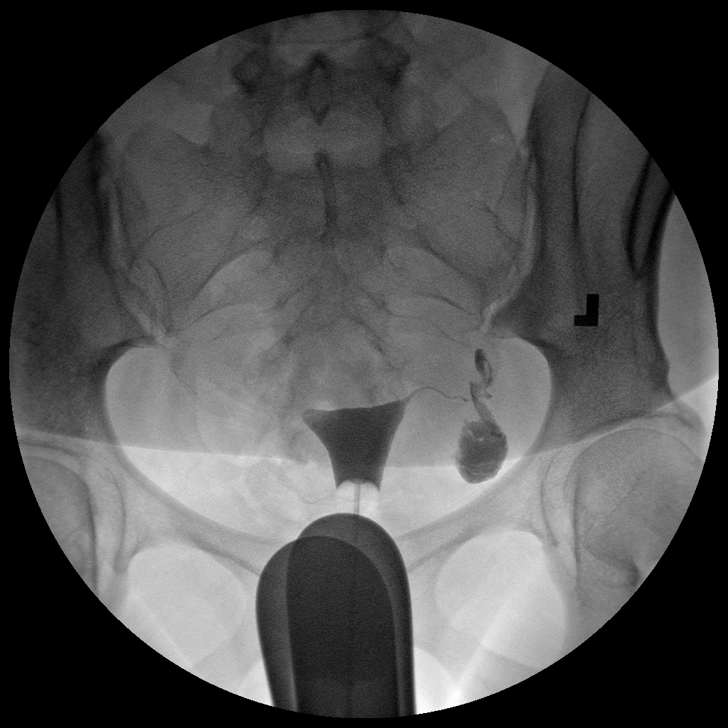

[Series 4: run · 1 of 1 slices shown (4 of 8)]
[im 1/1]
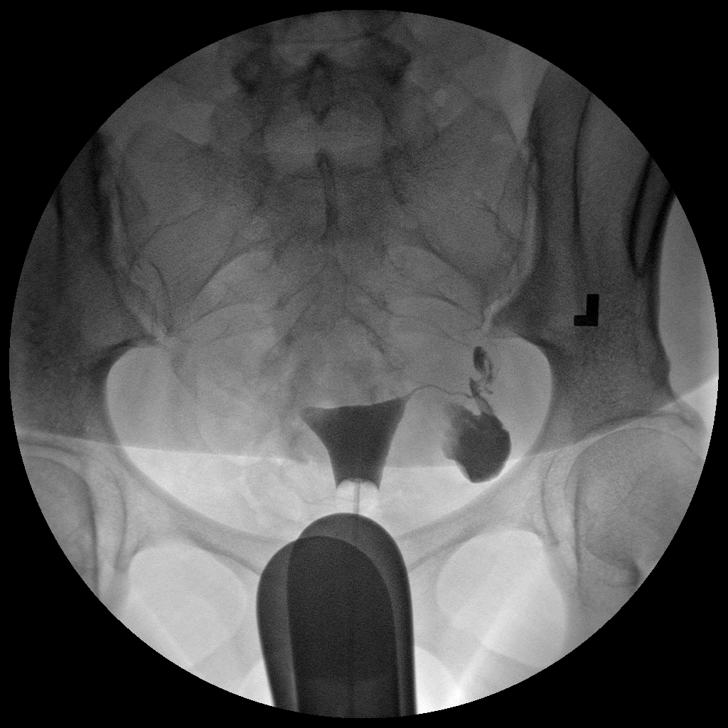

[Series 5: run · 1 of 1 slices shown (5 of 8)]
[im 1/1]
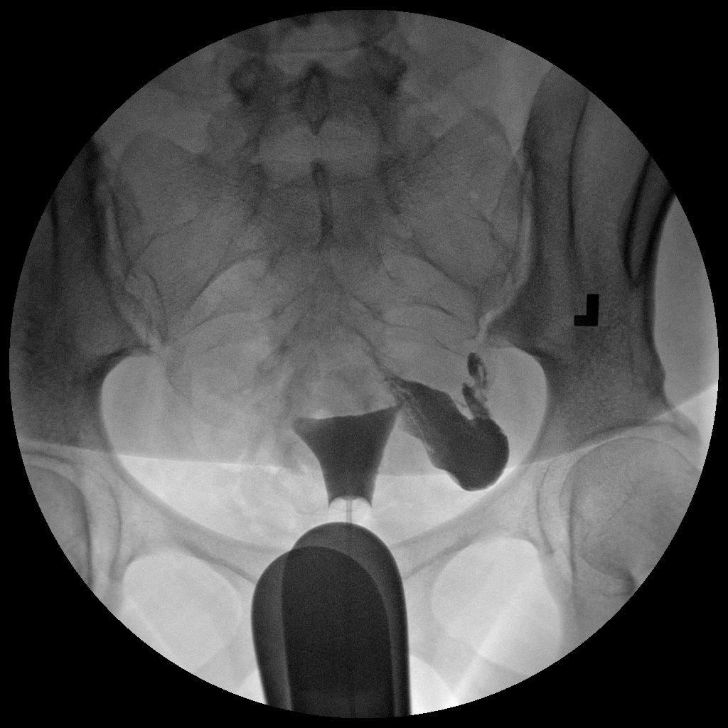

[Series 6: run · 1 of 1 slices shown (6 of 8)]
[im 1/1]
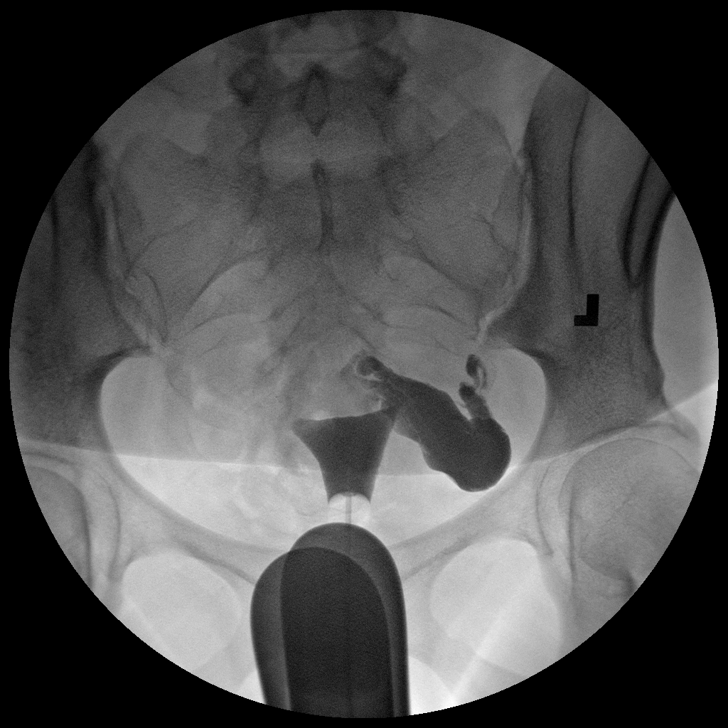

[Series 7: run · 1 of 1 slices shown (7 of 8)]
[im 1/1]
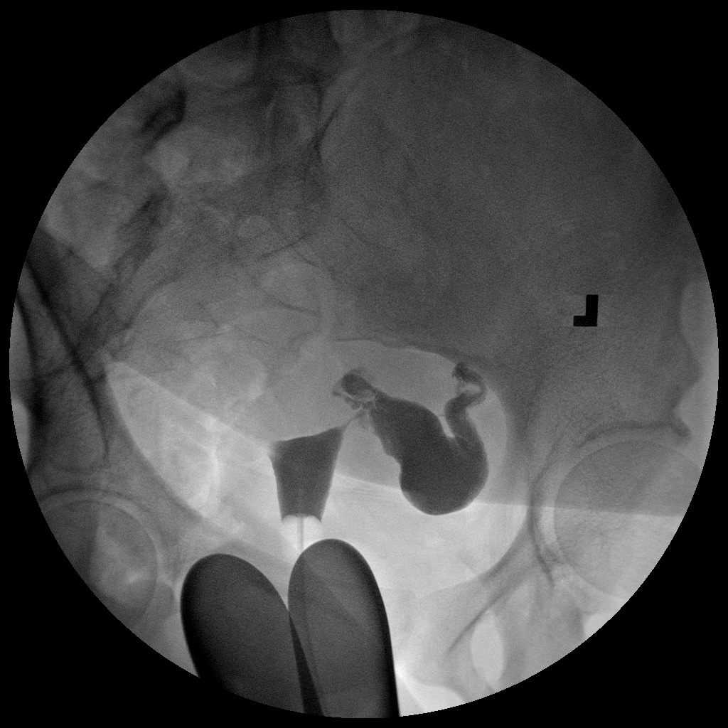

[Series 8: run · 1 of 1 slices shown (8 of 8)]
[im 1/1]
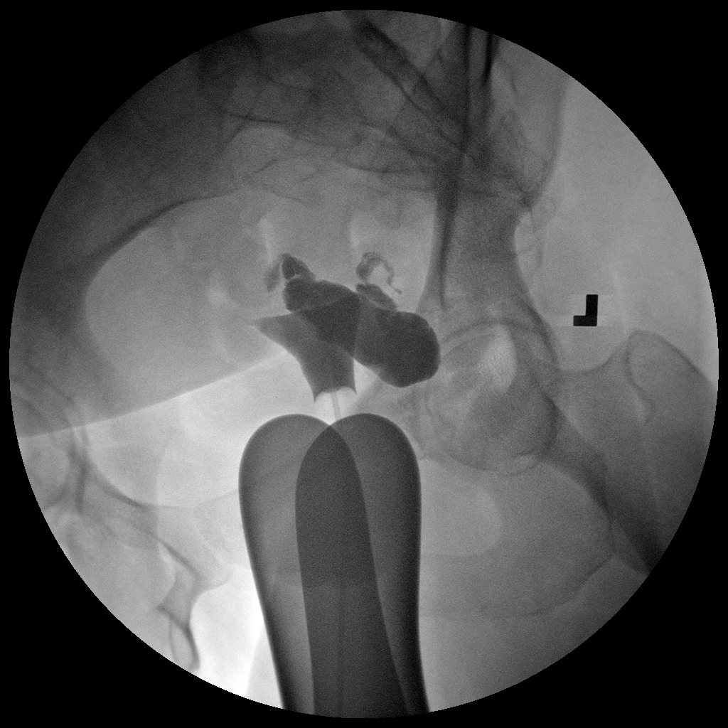

[8 of 8 positions shown; findings below may reference images not displayed]

FINDINGS: Normal appearance of the endometrial cavity. No filling of the right
fallopian tube compatible with prior surgical resection. The left
fallopian tube fills. The distal left fallopian tube is dilated
compatible with hydrosalpinx as seen on prior ultrasound. No free
spillage is visualized.
IMPRESSION: Distal left fallopian tube hydrosalpinx. No free spillage.

## 2014-10-29 ENCOUNTER — Telehealth: Payer: Self-pay | Admitting: Family

## 2014-10-29 NOTE — Progress Notes (Signed)
Based on what you shared with me it looks like you have a serious condition that should be evaluated in a face to face office visit.  If you are having a true medical emergency please call 911.  If you need an urgent face to face visit,  has four urgent care centers for your convenience.  . [facility] a Provider at this Location  [address] [place], [place] [address] . 8 am to 8 pm Monday-Friday . 9 am to 7 pm Saturday-Sunday  . [facility] [place] a Provider at this Location  [address], [employer] [place], [place] [address] . 8 am to 8 pm Monday-Friday . 9 am to 6 pm Saturday . 11 am to 6 pm Sunday   . [facility] at [place] Get Driving Directions  [address], [place] [address] . 8 am to 8 pm Monday-Friday . 9 am to 4 pm Saturday-Sunday   . [facility] (a walk in primary care provider)  [place] a Provider at this Location  [place], [place] [address] . 8 am to 8:30 pm Monday-Thursday . 8 am to 6 pm Friday . 8 am to 4 pm Saturday-Sunday   Your e-visit answers were reviewed by a board certified advanced clinical practitioner to complete your personal care plan.  Depending on the condition, your plan could have included both over the counter or prescription medications.  You will get an e-mail in the next two days asking about your experience.  I hope that your e-visit has been valuable and will speed your recovery . Thank you for choosing an e-visit.

## 2015-04-18 DIAGNOSIS — E1165 Type 2 diabetes mellitus with hyperglycemia: Secondary | ICD-10-CM | POA: Diagnosis not present

## 2015-04-18 DIAGNOSIS — K649 Unspecified hemorrhoids: Secondary | ICD-10-CM | POA: Diagnosis not present

## 2015-04-18 DIAGNOSIS — E1142 Type 2 diabetes mellitus with diabetic polyneuropathy: Secondary | ICD-10-CM | POA: Diagnosis not present

## 2015-04-18 DIAGNOSIS — M25552 Pain in left hip: Secondary | ICD-10-CM | POA: Diagnosis not present

## 2015-04-19 ENCOUNTER — Other Ambulatory Visit (HOSPITAL_BASED_OUTPATIENT_CLINIC_OR_DEPARTMENT_OTHER): Payer: Self-pay | Admitting: Family Medicine

## 2015-04-19 ENCOUNTER — Ambulatory Visit (HOSPITAL_BASED_OUTPATIENT_CLINIC_OR_DEPARTMENT_OTHER)
Admission: RE | Admit: 2015-04-19 | Discharge: 2015-04-19 | Disposition: A | Discharge status: Home / Self Care | Payer: 59 | Source: Ambulatory Visit | Attending: Family Medicine | Admitting: Family Medicine

## 2015-04-19 DIAGNOSIS — M25552 Pain in left hip: Secondary | ICD-10-CM

## 2015-04-19 DIAGNOSIS — S79912A Unspecified injury of left hip, initial encounter: Secondary | ICD-10-CM | POA: Diagnosis not present

## 2015-05-02 DIAGNOSIS — E782 Mixed hyperlipidemia: Secondary | ICD-10-CM | POA: Diagnosis not present

## 2015-05-02 DIAGNOSIS — M25552 Pain in left hip: Secondary | ICD-10-CM | POA: Diagnosis not present

## 2015-05-02 DIAGNOSIS — Z9119 Patient's noncompliance with other medical treatment and regimen: Secondary | ICD-10-CM | POA: Diagnosis not present

## 2015-05-02 DIAGNOSIS — E1165 Type 2 diabetes mellitus with hyperglycemia: Secondary | ICD-10-CM | POA: Diagnosis not present

## 2015-05-02 DIAGNOSIS — E1142 Type 2 diabetes mellitus with diabetic polyneuropathy: Secondary | ICD-10-CM | POA: Diagnosis not present

## 2015-05-28 DIAGNOSIS — Z6831 Body mass index (BMI) 31.0-31.9, adult: Secondary | ICD-10-CM | POA: Diagnosis not present

## 2015-05-28 DIAGNOSIS — R875 Abnormal microbiological findings in specimens from female genital organs: Secondary | ICD-10-CM | POA: Diagnosis not present

## 2015-05-28 DIAGNOSIS — Z01419 Encounter for gynecological examination (general) (routine) without abnormal findings: Secondary | ICD-10-CM | POA: Diagnosis not present

## 2015-06-09 ENCOUNTER — Encounter: Payer: 59 | Attending: Family Medicine

## 2015-06-09 DIAGNOSIS — E119 Type 2 diabetes mellitus without complications: Secondary | ICD-10-CM

## 2015-06-09 DIAGNOSIS — Z713 Dietary counseling and surveillance: Secondary | ICD-10-CM | POA: Insufficient documentation

## 2015-06-11 NOTE — Progress Notes (Signed)
Patient was seen on 06/09/15 for the complete diabetes self-management series at the [facility]. This is a part of the Link to [employer].  Handouts given during class include:  Living Well with Diabetes book  Carb Counting and Meal Planning book  Meal Plan Card  Carbohydrate guide  Meal planning worksheet  Low Sodium Flavoring Tips  The diabetes portion plate  Low Carbohydrate Snack Suggestions  A1c to eAG Conversion Chart  Diabetes Medications  Stress Management  Diabetes Recommended Care Schedule  Diabetes Success Plan  Core Class Satisfaction Survey  The following learning objectives were met by the patient during this course:  Describe diabetes  State some common risk factors for diabetes  Defines the role of glucose and insulin  Identifies type of diabetes and pathophysiology  Describe the relationship between diabetes and cardiovascular risk  State the members of the Healthcare Team  States the rationale for glucose monitoring  State when to test glucose  State their individual Target Range  State the importance of logging glucose readings  Describe how to interpret glucose readings  Identifies A1C target  Explain the correlation between A1c and eAG values  State symptoms and treatment of high blood glucose  State symptoms and treatment of low blood glucose  Explain proper technique for glucose testing  Identifies proper sharps disposal  Describe the role of different macronutrients on glucose  Explain how carbohydrates affect blood glucose  State what foods contain the most carbohydrates  Demonstrate carbohydrate counting  Demonstrate how to read Nutrition Facts food label  Describe effects of various fats on heart health  Describe the importance of good nutrition for health and healthy eating strategies  Describe techniques for managing your shopping, cooking and meal planning  List  strategies to follow meal plan when dining out  Describe the effects of alcohol on glucose and how to use it safely . State the amount of activity recommended for healthy living . Describe activities suitable for individual needs . Identify ways to regularly incorporate activity into daily life . Identify barriers to activity and ways to over come these barriers  Identify diabetes medications being personally used and their primary action for lowering glucose and possible side effects . Describe role of stress on blood glucose and develop strategies to address psychosocial issues . Identify diabetes complications and ways to prevent them  Explain how to manage diabetes during illness . Evaluate success in meeting personal goal . Establish 2-3 goals that they will plan to diligently work on until they return for the  48-monthfollow-up visit  Goals:   I will count my carb choices at most meals and snacks  I will be active 30 minutes or more 3 times a week  I will take my diabetes medications as scheduled  I will eat less unhealthy fats by eating less carbs  I will test my glucose at least 2 times a day, 7 days a week  I will look at patterns in my record book at least 2 days a month  To help manage stress I will  meditate at least 2 times a week  Your patient has identified these potential barriers to change:  Finances  Your patient has identified their diabetes self-care support plan as  Family Support Link to Wellness  Plan: Follow up with [facility] to [facility]

## 2015-06-12 ENCOUNTER — Other Ambulatory Visit (HOSPITAL_COMMUNITY): Payer: Self-pay | Admitting: Endocrinology

## 2015-06-12 DIAGNOSIS — E049 Nontoxic goiter, unspecified: Secondary | ICD-10-CM

## 2015-06-12 DIAGNOSIS — E1165 Type 2 diabetes mellitus with hyperglycemia: Secondary | ICD-10-CM | POA: Diagnosis not present

## 2015-06-12 DIAGNOSIS — Z319 Encounter for procreative management, unspecified: Secondary | ICD-10-CM | POA: Diagnosis not present

## 2015-06-16 DIAGNOSIS — Z319 Encounter for procreative management, unspecified: Secondary | ICD-10-CM | POA: Diagnosis not present

## 2015-06-19 ENCOUNTER — Ambulatory Visit: Payer: 59 | Admitting: *Deleted

## 2015-06-22 ENCOUNTER — Ambulatory Visit (HOSPITAL_BASED_OUTPATIENT_CLINIC_OR_DEPARTMENT_OTHER)
Admission: RE | Admit: 2015-06-22 | Discharge: 2015-06-22 | Disposition: A | Discharge status: Home / Self Care | Payer: 59 | Source: Ambulatory Visit | Attending: Endocrinology | Admitting: Endocrinology

## 2015-06-22 DIAGNOSIS — E042 Nontoxic multinodular goiter: Secondary | ICD-10-CM | POA: Diagnosis not present

## 2015-06-22 DIAGNOSIS — E049 Nontoxic goiter, unspecified: Secondary | ICD-10-CM | POA: Diagnosis present

## 2015-06-22 MED FILL — CONTOUR NEXT STRIPS: 50 days supply | Qty: 50 | Fill #0

## 2015-06-22 MED FILL — CONTOUR NEXT METER: W/DEVICE | 30 days supply | Qty: 1 | Fill #0

## 2015-06-25 ENCOUNTER — Ambulatory Visit (HOSPITAL_COMMUNITY): Payer: 59

## 2015-06-27 ENCOUNTER — Other Ambulatory Visit (HOSPITAL_COMMUNITY): Payer: Self-pay | Admitting: Endocrinology

## 2015-06-27 DIAGNOSIS — E041 Nontoxic single thyroid nodule: Secondary | ICD-10-CM

## 2015-07-02 DIAGNOSIS — Z131 Encounter for screening for diabetes mellitus: Secondary | ICD-10-CM | POA: Diagnosis not present

## 2015-07-02 DIAGNOSIS — N7011 Chronic salpingitis: Secondary | ICD-10-CM | POA: Diagnosis not present

## 2015-07-02 DIAGNOSIS — E119 Type 2 diabetes mellitus without complications: Secondary | ICD-10-CM | POA: Diagnosis not present

## 2015-07-02 DIAGNOSIS — R102 Pelvic and perineal pain: Secondary | ICD-10-CM | POA: Diagnosis not present

## 2015-07-04 ENCOUNTER — Encounter: Payer: Self-pay | Admitting: *Deleted

## 2015-07-04 ENCOUNTER — Other Ambulatory Visit: Payer: Self-pay | Admitting: *Deleted

## 2015-07-04 ENCOUNTER — Ambulatory Visit (HOSPITAL_COMMUNITY)
Admission: RE | Admit: 2015-07-04 | Discharge: 2015-07-04 | Disposition: A | Discharge status: Home / Self Care | Payer: 59 | Source: Ambulatory Visit | Attending: Endocrinology | Admitting: Endocrinology

## 2015-07-04 VITALS — BP 138/78 | Ht 62.0 in | Wt 179.2 lb

## 2015-07-04 DIAGNOSIS — E041 Nontoxic single thyroid nodule: Secondary | ICD-10-CM | POA: Insufficient documentation

## 2015-07-04 DIAGNOSIS — E042 Nontoxic multinodular goiter: Secondary | ICD-10-CM | POA: Diagnosis not present

## 2015-07-04 DIAGNOSIS — E049 Nontoxic goiter, unspecified: Secondary | ICD-10-CM | POA: Diagnosis not present

## 2015-07-04 DIAGNOSIS — E119 Type 2 diabetes mellitus without complications: Secondary | ICD-10-CM

## 2015-07-04 MED ORDER — LIDOCAINE HCL (PF) 1 % IJ SOLN
INTRAMUSCULAR | Status: DC
Start: 2015-07-04 — End: 2015-07-05
  Filled 2015-07-04: qty 10

## 2015-07-04 MED FILL — TRULICITY 1.5 MG/0.5 ML PEN: 1.5 | 28 days supply | Qty: 2 | Fill #0

## 2015-07-04 MED FILL — INVOKANA 300 MG TABLET: 300 | 30 days supply | Qty: 30 | Fill #0

## 2015-07-04 NOTE — Patient Outreach (Signed)
[facility]) Care Management   07/04/2015  Wanda [name] Jul 29, 1980 [id number]  Wanda [name] is an 35 y.o. female who presents to the [place] Management office to enroll in the Link To Wellness program for self management assistance with Type II DM.  Subjective:  Azion states she was diagnosed with Type II DM in 2012 and up until now she has not been in good control because she says she was not motivated to improve her blood sugar control. She says she is now interested in enrolling in the Link To Wellness program for additional help in improving her blood sugar control as she and her husband will be undergoing in vitro fertilization soon and her MDs have told her that her Hgb A1C should be <7.0%. She is also appreciative of the benefits gained through enrolling in the program such as free education and free testing supplies and low cost or free DM medications. She says she attended the required 8 hour Saturday Type II DM Core class on 06/09/15 and says it was very beneficial.  She defines her hypoglycemia threshold as 90 and states she checks her blood sugar 3 times daily, in the morning and after lunch and dinner. She says she just came from having a thyroid biopsy.  Objective:   Band-Aid intact, and clean and dry, to right neck covering thyroid biopsy site. Review of Systems  Constitutional: Negative.     Physical Exam  Constitutional: She is oriented to person, place, and time. She appears well-developed and well-nourished.  Respiratory: Effort normal.  Neurological: She is alert and oriented to person, place, and time.  Skin: Skin is warm and dry.  Psychiatric: She has a normal mood and affect. Her behavior is normal. Judgment and thought content normal.   Filed Weights   07/04/15 1545  Weight: 179 lb 3.2 oz (81.285 kg)   Filed Vitals:   07/04/15 1545  BP: 138/78   Encounter Medications:   Outpatient Encounter  Prescriptions as of 07/04/2015  Medication Sig  . Canagliflozin-Metformin HCl (INVOKAMET) 150-500 MG TABS Take 1 tablet by mouth daily.  . Dulaglutide (TRULICITY) 1.5 0000000 SOPN Inject 1.5 mg into the skin once a week.She injects on Thursday      Functional Status:   In your present state of health, do you have any difficulty performing the following activities: 07/04/2015  Hearing? N  Vision? N  Difficulty concentrating or making decisions? N  Walking or climbing stairs? N  Dressing or bathing? N  Doing errands, shopping? N    Fall/Depression Screening:    PHQ 2/9 Scores 07/04/2015 06/11/2015  PHQ - 2 Score 0 0    Assessment:   [employer] employee with Type II DM and obesity enrolling in the Link To Wellness program for self management assistance.  Plan:  [facility] CM Care Plan Problem One        Most Recent Value   Care Plan Problem One  [employer] employee with Type II DM enrolling in the Link To Wellness program for self management assistance, not meeting treatment target Hgb A1C of <7.0% as evidenced by most recent Hgb A1C= 8.6% on 07/02/15   Role Documenting the Problem One  Care Management [place] for Problem One  Active   THN Long Term Goal (31-90 days)  Improved glycemic control as evidenced by Hgb A1C<7.0% with >80% of home monitored blood sugars meeting target   [place] Term Goal Start Date  07/04/15   Interventions for Problem One Long Term Goal  Discussed Link to Wellness program goals, requirements and benefits, reviewed member's rights and responsibilities ,provided diabetes information packet with explanation of contents, ensured member agreed and signed consent to participate and authorization to release and receive health information, consent, participation agreement and consent to enroll in program, assessed member's current knowledge of diabetes,  using the [employer] representation, discussed the 8 core pathophysiologic deficits in Type II  diabetes. Discussed physiology of diabetes as a chronic progressive disease with the initial problem of insulin resistance in the muscle, liver and fat cells and then increased loss of beta cell function over time resulting in decreased insulin production, discussed role of obesity, especially abdominal (visceral) obesity, on insulin resistance, reviewed patient's medications and assessed medication adherence, discussed DM medications of Invokamet and Trulicity including the mechanism of action, common side effects, dosages and dosing schedule, reinforced importance of taking all medications as prescribed, discussed the need for the use of a combination of DM medications to correct the pathophysiologic core deficits and to  prevent or slow beta cell failure, provided education on the three primary macronutrients (CHO, protein, fat) and their effect on glucose levels,  provided education on carb counting, importance of regularly scheduled meals/snacks, and meal planning, reviewed approximate amount of CHOs to aim for at meals ( 30-45 gm ) and snacks (15 gms), discussed recommended daily amounts of fiber (30 gm)  and protein ( 46 for adult women) and how these two macronutrients help with satiety, used food models and measuring cups as teaching tool to educate patient on portion size and macronutrients, provided handout on basic CHO counting and portion size,discussed nutritional counseling benefit provided by [facility] health plan, discussed effects of physical activity on glucose levels and long-term glucose control by improving insulin sensitivity and assisting with weight management and cardiovascular health, encouraged patient to continue to exercise,  reviewed American Diabetes Association recommendations of 150 minutes of exercise per week including two sessions of resistance exercise weekly, reviewed patient's meter history and/or CBG log, discussed blood glucose monitoring and interpretation, discussed  recommended target ranges for pre-meal and post-meal, provided blood sugar log sheets with targets for pre and post meal, reviewed definitions of hyperglycemia and hypoglycemia, assessed patient's hypoglycemia threshold and usual symptoms, discussed the role of stress on blood sugar, discussed free mental health benefits offered through [place] [facility],discussed recommendations for day to day and long-term diabetes self-care, reviewed recommended daily foot checks, and yearly cholesterol, urine, and eye testing- emphasized the importance of getting eye exam ASAP since she has not had an exam since her DM diagnosis in 2012, and recommendations for medical, dental, and emotional self-care, discussed and mutually set goals for food plan, physical activity, medication adherence, problem solving and blood glucose testing and provided goal sheet,  reviewed upcoming appointments with patient's primary care MD, reinforced the importance of keeping the appointment, will arrange to see Kelleigh in follow up at her work site in May       RNCM to fax today's office visit note to Dr. Luciana Axe. RNCM will meet at least quarterly and as needed with patient per Link To Wellness program guidelines to assist with Type II DM self-management and assess patient's progress toward mutually set goals.  Barrington Ellison RN,CCM,CDE [place] Management Coordinator Link To Wellness Office Phone [phone] Office Fax [phone]

## 2015-07-04 NOTE — Procedures (Signed)
   US guided Rt thyroid nodule Inferior and superior nodules  25 g needle aspirate x 3 for each nodule  Pt tolerated well

## 2015-07-24 ENCOUNTER — Ambulatory Visit: Payer: Self-pay | Admitting: *Deleted

## 2015-08-07 ENCOUNTER — Other Ambulatory Visit: Payer: Self-pay | Admitting: *Deleted

## 2015-08-07 ENCOUNTER — Encounter: Payer: Self-pay | Admitting: *Deleted

## 2015-08-07 NOTE — Patient Outreach (Signed)
[facility]) Care Management   08/07/2015  Wanda [name] 06-25-1980 [id number]  Wanda [name] is an 35 y.o. female is seen in private at her work site conference room for self management assistance with Type II DM.  Subjective:  [name] says her thyroid biopsy came back negative for cancer. She says she will see Dr. Dwyane Dee later this month and will have surgery on her fallopian tubes in preparation for in vitro fertilization. She says she is checking her CBG daily and meeting targets most of the time.  Objective:   Review of Systems  Constitutional: Negative.     Physical Exam  Constitutional: She is oriented to person, place, and time. She appears well-developed and well-nourished.  Respiratory: Effort normal.  Neurological: She is alert and oriented to person, place, and time.  Skin: Skin is warm and dry.  Psychiatric: She has a normal mood and affect. Her behavior is normal. Judgment and thought content normal.    Encounter Medications:   Outpatient Encounter Prescriptions as of 08/07/2015  Medication Sig  . atorvastatin (LIPITOR) 20 MG tablet Take 1 tablet (20 mg total) by mouth daily at 6 PM. (Patient not taking: Reported on 06/11/2015)  . Canagliflozin-Metformin HCl (INVOKAMET) 150-500 MG TABS Take 1 tablet by mouth daily. Reported on 08/07/2015  . Dulaglutide (TRULICITY) 1.5 0000000 SOPN Inject 1.5 mg into the skin once a week. Reported on 08/07/2015  . FLUoxetine (PROZAC) 20 MG tablet Take 1 tablet (20 mg total) by mouth daily. (Patient not taking: Reported on 07/02/2015)  . lisinopril (PRINIVIL,ZESTRIL) 2.5 MG tablet Take 1 tablet (2.5 mg total) by mouth daily. (Patient not taking: Reported on 06/11/2015)  . metFORMIN (GLUCOPHAGE) 500 MG tablet Take 1 tablet (500 mg total) by mouth 2 (two) times daily with a meal. (Patient not taking: Reported on 06/11/2015)   No facility-administered encounter medications on file as of 08/07/2015.    Functional Status:    In your present state of health, do you have any difficulty performing the following activities: 07/04/2015  Hearing? N  Vision? N  Difficulty concentrating or making decisions? N  Walking or climbing stairs? N  Dressing or bathing? N  Doing errands, shopping? N    Fall/Depression Screening:    PHQ 2/9 Scores 07/04/2015 06/11/2015  PHQ - 2 Score 0 0    Assessment:   [employer] employee and Link To Wellness member with Type II DM with improved glycemic control as evidenced by improved CBG readings.  Plan:  [facility] CM Care Plan Problem One        Most Recent Value   Care Plan Problem One  [employer] employee with Type II DM not meeting treatment target Hgb A1C of <7.0% as evidenced by most recent Hgb A1C= 8.6% on 07/02/15 but with improved CBG per log history   Role Documenting the Problem One  Care Management [place] for Problem One  Active   THN Long Term Goal (31-90 days)  Improved glycemic control as evidenced by Hgb A1C<7.0% with >80% of home monitored blood sugars meeting target   [place] Term Goal Start Date  08/07/2015   Interventions for Problem One Long Term Goal  Reviewed medications and reinforced importance of taking all medications as prescribed, discussed upcoming surgical procedure on 6/15 to assist with successful in vitro fertilization, discussed the likely need for insulin during pregnancy to keep tight control of blood sugars, reviewed CBG log and discussed strategies to keep all blood  sugars in target, reviewed upcoming appointment with Dr. Dwyane Dee on 6/12, will arrange for Link To Wellness follow up after she sees Dr. Dwyane Dee with timing to be determined by Hgb A1C results     RNCM to fax today's office visit note to Dr. Luciana Axe. RNCM will meet quarterly and as needed with patient per Link To Wellness program guidelines to assist with Type II DM self-management and assess patient's progress toward mutually set goals. Barrington Ellison RN,CCM,CDE [place] Management Coordinator Link To Wellness Office Phone [phone] Office Fax [phone]

## 2015-08-08 ENCOUNTER — Other Ambulatory Visit: Payer: Self-pay | Admitting: Obstetrics & Gynecology

## 2015-08-20 ENCOUNTER — Other Ambulatory Visit (HOSPITAL_COMMUNITY): Payer: 59

## 2015-08-20 ENCOUNTER — Encounter: Payer: Self-pay | Admitting: Endocrinology

## 2015-08-20 ENCOUNTER — Ambulatory Visit (INDEPENDENT_AMBULATORY_CARE_PROVIDER_SITE_OTHER): Payer: 59 | Admitting: Endocrinology

## 2015-08-20 VITALS — BP 134/82 | HR 77 | Ht 63.0 in | Wt 176.0 lb

## 2015-08-20 DIAGNOSIS — E1165 Type 2 diabetes mellitus with hyperglycemia: Secondary | ICD-10-CM

## 2015-08-20 DIAGNOSIS — E049 Nontoxic goiter, unspecified: Secondary | ICD-10-CM

## 2015-08-20 MED FILL — TRULICITY 1.5 MG/0.5 ML PEN: 1.5 | 28 days supply | Qty: 2 | Fill #1 | Status: TO

## 2015-08-20 MED FILL — INVOKANA 300 MG TABLET: 300 | 30 days supply | Qty: 30 | Fill #0

## 2015-08-20 NOTE — Patient Instructions (Signed)
Check blood sugars on waking up 2-3  times a week Also check blood sugars about 2 hours after a meal and do this after different meals by rotation  Recommended blood sugar levels on waking up is 90-130 and about 2 hours after meal is 130-160  Please bring your blood sugar monitor to each visit, thank you  1/2 Invokana tab before Bfst

## 2015-08-20 NOTE — Progress Notes (Signed)
Patient ID: Wanda [name], female   DOB: 02-11-1981, 35 y.o.   MRN: [id number]           Reason for Appointment: Consultation for Type 2 Diabetes  Referring physician: Leavy Cella  History of Present Illness:          Date of diagnosis of type 2 diabetes mellitus:  2012      Background history:   She was initially prescribed metformin but because of diarrhea she was not able to take it and apparently took it very inconsistently Blood sugars had been progressively higher until about 04/2015 when her A1c was up to 12%  Recent history:         Non-insulin hypoglycemic drugs the patient is taking are: Invokana 300mg  qd before supper, Trulicity 1.5 mg weekly  Current management, blood sugar patterns and problems identified:  Her PCP started her on Trulicity and Invokana in 2/17 when her blood sugars were significantly high  also she was sent for diabetes education  She is taking are Invokana at suppertime and may occasionally get up at night to go to the bathroom also  She is currently planning a pregnancy and is interested in better blood sugar control  She has not had any significant yeast infection with Invokana but has had to use Monistat for minor symptoms twice and also this week may be having symptoms again.        Side effects from medications have been:mild candidiasis from Invokana  Compliance with the medical regimen:improved recently   Glucose monitoring:  done <1 times a day         Glucometer: One Touch.      Blood Glucose readings by time of day and averages from meter download:  PREMEAL Breakfast Lunch Dinner Bedtime  Overall   Glucose range: 148 130 132-159 97 97-159  Median:         Self-care: The diet that the patient has been following is: tries to limit Carbs to 45g.     Meal times are:  Breakfast is at 7 Lunch: Dinner: 6-7  Typical meal intake: Usually eating Honey nut Cheerios or cinnamon toast for breakfast.  Lunch  is a sandwich and evening meal   is  usually baked chicken, rice and broccoli         Dietician visit, most recent:5/17 with CDE               Exercise: She is trying to walk on the treadmill about 30 minutes, at least 3 days a week, has been doing this for about 3 months or so  Weight history: Range 175-203  Wt Readings from Last 3 Encounters:  08/20/15 176 lb (79.833 kg)  07/04/15 179 lb 3.2 oz (81.285 kg)  05/23/14 182 lb 6.4 oz (82.736 kg)    Glycemic control: 8.6   Lab Results  Component Value Date   HGBA1C 11.8* 05/23/2014   Lab Results  Component Value Date   CREATININE 0.57 05/23/2014   No results found for: MICRALBCREAT  Micral 38     Medication List       This list is accurate as of: 08/20/15  9:07 PM.  Always use your most recent med list.               BAYER CONTOUR NEXT MONITOR w/Device Kit     INVOKANA 300 MG Tabs tablet  Generic drug:  canagliflozin     TRULICITY 1.5 MG/0.5ML Sopn  Generic drug:  Dulaglutide  Inject 1.5 mg into the skin once a week. Reported on 08/07/2015        Allergies: No Known Allergies  Past Medical History  Diagnosis Date  . Diabetes mellitus   . Anemia   . Anxiety   . Depression     Past Surgical History  Procedure Laterality Date  . Oophrectomy      right    Family History  Problem Relation Age of Onset  . Cancer Father   . Multiple myeloma Father   . Hypertension Mother   . Diabetes Neg Hx   . Thyroid disease Neg Hx     Social History:  reports that she has never smoked. She has never used smokeless tobacco. She reports that she drinks alcohol. She reports that she does not use illicit drugs.   Review of Systems  Constitutional: Positive for weight loss.  Eyes: Negative for blurred vision.  Respiratory: Negative for shortness of breath.   Cardiovascular: Negative for chest pain and leg swelling.  Gastrointestinal: Negative for diarrhea.       Has minimal nausea from using Trulicity  Endocrine: Positive for polydipsia. Negative  for menstrual changes.  Genitourinary:       Nocturia 0-1x  Musculoskeletal: Negative for joint pain.  Skin: Negative for itching.  Neurological: Positive for tingling. Negative for numbness.       Off and on pins and needles, recently better and more on sitting for long    Lipid history: LDL 96    No results found for: CHOL, HDL, LDLCALC, LDLDIRECT, TRIG, CHOLHDL         Hypertension:none  Most recent eye exam was 2014  Most recent foot exam:6/17  She was found to have a thyroid swelling when she was seen by her gynecologist for routine exam.  She was sent for an ultrasound and the endocrinologist recommended a biopsy.  Also thyroid functions were normal. She is concerned that she has not heard about the results of her ultrasound biopsy.  She does not have any current difficulty swallowing or pressure or choking on her neck area  ULTRASOUND results:  Right, superior - 5.1 x 3.9 x 3.1 cm - mixed echogenic, solid.  Right, inferior - 4.0 x 3.4 x 2.6 cm - mixed echogenic, solid  Needle aspiration biopsy in 4/17 showed benign follicular nodules  Lab Results  Component Value Date   TSH 2.200 05/23/2014   TSH 2.228 02/06/2010     BP 134/82 mmHg  Pulse 77  Ht $R'5\' 3"'tf$  (1.6 m)  Wt 176 lb (79.833 kg)  BMI 31.18 kg/m2  SpO2 97%  GENERAL:         Patient has generalized obesity.   HEENT:         Eye exam shows normal external appearance. Fundus exam shows no retinopathy.  Oral exam shows somewhat dry mucosa .  NECK:   There is no lymphadenopathy  no significant acanthosis  Thyroid is significantly enlarged, mostly in the midline.  Neck circumference is 41cm in the nodule in the midline may sugars about 6.1 cm across Lateral lobes are not palpable Pemberton sign negative No stridor heard LUNGS:         Chest is symmetrical. Lungs are clear to auscultation.Marland Kitchen   HEART:         Heart sounds:  S1 and S2 are normal. No murmur or click heard., no S3 or S4.   ABDOMEN:   There is no  distention present. Liver and spleen are  not palpable. No other mass or tenderness present.   NEUROLOGICAL:   Ankle jerks are absent bilaterally.    Diabetic Foot Exam - Simple   Simple Foot Form  Diabetic Foot exam was performed with the following findings:  Yes 08/20/2015  2:57 PM  Visual Inspection  No deformities, no ulcerations, no other skin breakdown bilaterally:  Yes  Sensation Testing  Intact to touch and monofilament testing bilaterally:  Yes  Pulse Check  Posterior Tibialis and Dorsalis pulse intact bilaterally:  Yes  Comments             Vibration sense is  reduced in distal first toes. MUSCULOSKELETAL:  There is no swelling or deformity of the peripheral joints. Spine is normal to inspection.   EXTREMITIES:     There is no edema. No skin lesions present.Marland Kitchen SKIN:       No rash or lesions of concern.        ASSESSMENT:  Diabetes type 2, uncontrolled   Although she has had persistently poor control of her diabetes with obesity she appears to have had much better control in the last few weeks.  Previously intolerant to metformin Appears to be benefiting from a combination of Trulicity and Invokana 161 mg She has improved her diet consistently and has been able to lose weight Also is starting a walking program for exercise  Complications of diabetes:none, needs eye exam and needs repeat microalbumin done, previous level was mildly increased because of poor control  THYROID nodules: These are benign She is concerned about the size of the nodules and the nature of the nodules. Patient was explained the nature of thyroid nodules and usually natural history Discussed that use of thyroid suppression is only helpful in a few patients and may not prevent further growth.  She was given the option of trying thyroid suppression for 6 months but currently she does not want to do this.  PLAN:    Since she is having tendency to recurrent candidiasis she can reduce Invokana to half  tablet  She will take Invokana in the morning instead of suppertime for better control of postprandial readings during the day as well as avoiding nocturia  Continue Trulicity  Discussed timing and targets of blood sugars at various times  She will need an A1c done again in about 6 weeks  At this time her thyroid nodules will be monitored for any change in size   Patient Instructions  Check blood sugars on waking up 2-3  times a week Also check blood sugars about 2 hours after a meal and do this after different meals by rotation  Recommended blood sugar levels on waking up is 90-130 and about 2 hours after meal is 130-160  Please bring your blood sugar monitor to each visit, thank you  1/2 Invokana tab before Bfst      Counseling time on subjects discussed above is over 50% of today's 50 minute visit   Kenniya Westrich 08/20/2015, 9:07 PM   Note: This office note was prepared with Dragon voice recognition system technology. Any transcriptional errors that result from this process are unintentional.

## 2015-08-22 NOTE — Patient Instructions (Addendum)
Your procedure is scheduled on:  Friday, June 16  Enter through the [employer] [facility] at: [employer] up the phone at the desk and dial [phone].  Call this number if you have problems the morning of surgery: [phone].  Remember: Do NOT eat or drink after midnight Thursday, 6/15  Take these medicines the morning of surgery with a SIP OF WATER: None.  Do not take your diabetes medicine on Friday, day of surgery.  We will check your blood sugar and treat if needed.    Do NOT wear jewelry (body piercing), metal hair clips/bobby pins, make-up, or nail polish. Do NOT wear lotions, powders, or perfumes.  You may wear deoderant. Do NOT shave for 48 hours prior to surgery. Do NOT bring valuables to the hospital.  Have a responsible adult drive you home and stay with you for 24 hours after your procedure.  Home with husband Deshawn cell [phone]

## 2015-08-22 NOTE — Anesthesia Preprocedure Evaluation (Signed)
Anesthesia Evaluation  Patient identified by MRN, date of birth, ID band Patient awake    Reviewed: Allergy & Precautions, H&P , Patient's Chart, lab work & pertinent test results, reviewed documented beta blocker date and time   Airway Mallampati: II  TM Distance: >3 FB Neck ROM: full    Dental no notable dental hx.    Pulmonary    Pulmonary exam normal breath sounds clear to auscultation       Cardiovascular  Rhythm:regular Rate:Normal     Neuro/Psych    GI/Hepatic   Endo/Other  diabetes, Type 2  Renal/GU      Musculoskeletal   Abdominal   Peds  Hematology   Anesthesia Other Findings   Reproductive/Obstetrics                             Anesthesia Physical Anesthesia Plan  ASA: II  Anesthesia Plan: General   Post-op Pain Management:    Induction: Intravenous  Airway Management Planned: Oral ETT  Additional Equipment:   Intra-op Plan:   Post-operative Plan: Extubation in OR  Informed Consent: I have reviewed the patients History and Physical, chart, labs and discussed the procedure including the risks, benefits and alternatives for the proposed anesthesia with the patient or authorized representative who has indicated his/her understanding and acceptance.   Dental Advisory Given and Dental advisory given  Plan Discussed with: CRNA and Surgeon  Anesthesia Plan Comments: (  Discussed general anesthesia, including possible nausea, instrumentation of airway, sore throat,pulmonary aspiration, etc. I asked if the were any outstanding questions, or  concerns before we proceeded. )        Anesthesia Quick Evaluation

## 2015-08-23 ENCOUNTER — Encounter (HOSPITAL_COMMUNITY)
Admission: RE | Admit: 2015-08-23 | Discharge: 2015-08-23 | Disposition: A | Discharge status: Home / Self Care | Payer: 59 | Source: Ambulatory Visit | Attending: Obstetrics & Gynecology | Admitting: Obstetrics & Gynecology

## 2015-08-23 ENCOUNTER — Encounter (HOSPITAL_COMMUNITY): Payer: Self-pay

## 2015-08-23 DIAGNOSIS — N803 Endometriosis of pelvic peritoneum: Secondary | ICD-10-CM | POA: Diagnosis not present

## 2015-08-23 DIAGNOSIS — N838 Other noninflammatory disorders of ovary, fallopian tube and broad ligament: Secondary | ICD-10-CM | POA: Diagnosis not present

## 2015-08-23 DIAGNOSIS — E119 Type 2 diabetes mellitus without complications: Secondary | ICD-10-CM | POA: Diagnosis not present

## 2015-08-23 DIAGNOSIS — N7011 Chronic salpingitis: Secondary | ICD-10-CM | POA: Diagnosis not present

## 2015-08-23 DIAGNOSIS — F329 Major depressive disorder, single episode, unspecified: Secondary | ICD-10-CM | POA: Diagnosis not present

## 2015-08-23 DIAGNOSIS — R102 Pelvic and perineal pain: Secondary | ICD-10-CM | POA: Diagnosis present

## 2015-08-23 DIAGNOSIS — F419 Anxiety disorder, unspecified: Secondary | ICD-10-CM | POA: Diagnosis not present

## 2015-08-23 HISTORY — DX: Nontoxic goiter, unspecified: E04.9

## 2015-08-23 LAB — COMPREHENSIVE METABOLIC PANEL WITH GFR
ALT: 23 U/L (ref 14–54)
AST: 21 U/L (ref 15–41)
Albumin: 4.2 g/dL (ref 3.5–5.0)
Alkaline Phosphatase: 68 U/L (ref 38–126)
Anion gap: 8 (ref 5–15)
BUN: 14 mg/dL (ref 6–20)
CO2: 24 mmol/L (ref 22–32)
Calcium: 9.1 mg/dL (ref 8.9–10.3)
Chloride: 103 mmol/L (ref 101–111)
Creatinine, Ser: 0.58 mg/dL (ref 0.44–1.00)
GFR calc Af Amer: >60 mL/min
GFR calc non Af Amer: >60 mL/min
Glucose, Bld: 92 mg/dL (ref 65–99)
Potassium: 3.3 mmol/L — ABNORMAL LOW (ref 3.5–5.1)
Sodium: 135 mmol/L (ref 135–145)
Total Bilirubin: 0.8 mg/dL (ref 0.3–1.2)
Total Protein: 8.1 g/dL (ref 6.5–8.1)

## 2015-08-23 LAB — CBC
HCT: 36.5 % (ref 36.0–46.0)
Hemoglobin: 12.3 g/dL (ref 12.0–15.0)
MCH: 24.6 pg — ABNORMAL LOW (ref 26.0–34.0)
MCHC: 33.7 g/dL (ref 30.0–36.0)
MCV: 73 fL — ABNORMAL LOW (ref 78.0–100.0)
PLATELETS: 285 10*3/uL (ref 150–400)
RBC: 5 MIL/uL (ref 3.87–5.11)
RDW: 17 % — AB (ref 11.5–15.5)
WBC: 8 10*3/uL (ref 4.0–10.5)

## 2015-08-23 LAB — ABO/RH: ABO/RH(D): B POS

## 2015-08-23 LAB — TYPE AND SCREEN
ABO/RH(D): B POS
Antibody Screen: NEGATIVE

## 2015-08-24 ENCOUNTER — Ambulatory Visit (HOSPITAL_COMMUNITY)
Admission: RE | Admit: 2015-08-24 | Discharge: 2015-08-24 | Disposition: A | Discharge status: Home / Self Care | Payer: 59 | Source: Ambulatory Visit | Attending: Obstetrics & Gynecology | Admitting: Obstetrics & Gynecology

## 2015-08-24 ENCOUNTER — Encounter (HOSPITAL_COMMUNITY)
Admission: RE | Disposition: A | Discharge status: Home / Self Care | Payer: Self-pay | Source: Ambulatory Visit | Attending: Obstetrics & Gynecology

## 2015-08-24 ENCOUNTER — Ambulatory Visit (HOSPITAL_COMMUNITY): Payer: 59 | Admitting: Anesthesiology

## 2015-08-24 ENCOUNTER — Encounter (HOSPITAL_COMMUNITY): Payer: Self-pay | Admitting: *Deleted

## 2015-08-24 DIAGNOSIS — N7011 Chronic salpingitis: Secondary | ICD-10-CM | POA: Diagnosis not present

## 2015-08-24 DIAGNOSIS — F329 Major depressive disorder, single episode, unspecified: Secondary | ICD-10-CM | POA: Diagnosis not present

## 2015-08-24 DIAGNOSIS — N803 Endometriosis of pelvic peritoneum: Secondary | ICD-10-CM | POA: Insufficient documentation

## 2015-08-24 DIAGNOSIS — E119 Type 2 diabetes mellitus without complications: Secondary | ICD-10-CM | POA: Insufficient documentation

## 2015-08-24 DIAGNOSIS — F419 Anxiety disorder, unspecified: Secondary | ICD-10-CM | POA: Diagnosis not present

## 2015-08-24 DIAGNOSIS — N838 Other noninflammatory disorders of ovary, fallopian tube and broad ligament: Secondary | ICD-10-CM | POA: Diagnosis not present

## 2015-08-24 DIAGNOSIS — R102 Pelvic and perineal pain: Secondary | ICD-10-CM | POA: Diagnosis not present

## 2015-08-24 HISTORY — PX: LAPAROSCOPIC UNILATERAL SALPINGECTOMY: SHX5934

## 2015-08-24 HISTORY — PX: ROBOTIC ASSISTED LAPAROSCOPIC HYSTERECTOMY AND SALPINGECTOMY: SHX6379

## 2015-08-24 LAB — GLUCOSE, CAPILLARY
Glucose-Capillary: 120 mg/dL — ABNORMAL HIGH (ref 65–99)
Glucose-Capillary: 130 mg/dL — ABNORMAL HIGH (ref 65–99)

## 2015-08-24 LAB — PREGNANCY, URINE: Preg Test, Ur: NEGATIVE

## 2015-08-24 SURGERY — SALPINGECTOMY, UNILATERAL, LAPAROSCOPIC
Anesthesia: General | Site: Abdomen | Laterality: Left

## 2015-08-24 MED ORDER — BUPIVACAINE HCL (PF) 0.25 % IJ SOLN
INTRAMUSCULAR | Status: DC | PRN
  Administered 2015-08-24 (×2): 5 mL

## 2015-08-24 MED ORDER — DEXAMETHASONE SODIUM PHOSPHATE 4 MG/ML IJ SOLN
INTRAMUSCULAR | Status: AC
  Filled 2015-08-24: qty 1

## 2015-08-24 MED ORDER — KETOROLAC TROMETHAMINE 30 MG/ML IJ SOLN
INTRAMUSCULAR | Status: DC | PRN
  Administered 2015-08-24: 30 mg via INTRAVENOUS

## 2015-08-24 MED ORDER — GLYCOPYRROLATE 0.2 MG/ML IJ SOLN
INTRAMUSCULAR | Status: AC
  Filled 2015-08-24: qty 3

## 2015-08-24 MED ORDER — SCOPOLAMINE 1 MG/3DAYS TD PT72
MEDICATED_PATCH | TRANSDERMAL | Status: AC
  Administered 2015-08-24: 1.5 mg via TRANSDERMAL
  Filled 2015-08-24: qty 1

## 2015-08-24 MED ORDER — PHENYLEPHRINE 40 MCG/ML (10ML) SYRINGE FOR IV PUSH (FOR BLOOD PRESSURE SUPPORT)
PREFILLED_SYRINGE | INTRAVENOUS | Status: AC
  Filled 2015-08-24: qty 10

## 2015-08-24 MED ORDER — BUPIVACAINE HCL (PF) 0.25 % IJ SOLN
INTRAMUSCULAR | Status: AC
  Filled 2015-08-24: qty 30

## 2015-08-24 MED ORDER — FENTANYL CITRATE (PF) 100 MCG/2ML IJ SOLN
25.0000 ug | INTRAMUSCULAR | Status: DC | PRN
  Administered 2015-08-24: 25 ug via INTRAVENOUS
  Administered 2015-08-24: 50 ug via INTRAVENOUS

## 2015-08-24 MED ORDER — NEOSTIGMINE METHYLSULFATE 10 MG/10ML IV SOLN
INTRAVENOUS | Status: AC
  Filled 2015-08-24: qty 1

## 2015-08-24 MED ORDER — FENTANYL CITRATE (PF) 100 MCG/2ML IJ SOLN
INTRAMUSCULAR | Status: DC | PRN
  Administered 2015-08-24: 50 ug via INTRAVENOUS
  Administered 2015-08-24 (×2): 25 ug via INTRAVENOUS
  Administered 2015-08-24: 100 ug via INTRAVENOUS
  Administered 2015-08-24 (×2): 50 ug via INTRAVENOUS

## 2015-08-24 MED ORDER — FENTANYL CITRATE (PF) 250 MCG/5ML IJ SOLN
INTRAMUSCULAR | Status: AC
  Filled 2015-08-24: qty 5

## 2015-08-24 MED ORDER — CEFAZOLIN SODIUM-DEXTROSE 2-4 GM/100ML-% IV SOLN
2.0000 g | INTRAVENOUS | Status: AC
  Administered 2015-08-24: 2 g via INTRAVENOUS

## 2015-08-24 MED ORDER — SODIUM CHLORIDE 0.9 % IV SOLN
INTRAVENOUS | Status: DC | PRN
  Administered 2015-08-24: 120 mL

## 2015-08-24 MED ORDER — MIDAZOLAM HCL 2 MG/2ML IJ SOLN
INTRAMUSCULAR | Status: DC | PRN
  Administered 2015-08-24: 2 mg via INTRAVENOUS

## 2015-08-24 MED ORDER — ONDANSETRON HCL 4 MG/2ML IJ SOLN
INTRAMUSCULAR | Status: DC | PRN
  Administered 2015-08-24: 4 mg via INTRAVENOUS

## 2015-08-24 MED ORDER — NEOSTIGMINE METHYLSULFATE 10 MG/10ML IV SOLN
INTRAVENOUS | Status: DC | PRN
  Administered 2015-08-24: 4 mg via INTRAVENOUS

## 2015-08-24 MED ORDER — LACTATED RINGERS IV SOLN
INTRAVENOUS | Status: DC
  Administered 2015-08-24 (×2): via INTRAVENOUS

## 2015-08-24 MED ORDER — ARTIFICIAL TEARS OP OINT
TOPICAL_OINTMENT | OPHTHALMIC | Status: DC | PRN
  Administered 2015-08-24: 1 via OPHTHALMIC

## 2015-08-24 MED ORDER — LIDOCAINE HCL (CARDIAC) 20 MG/ML IV SOLN
INTRAVENOUS | Status: AC
  Filled 2015-08-24: qty 5

## 2015-08-24 MED ORDER — PROPOFOL 10 MG/ML IV BOLUS
INTRAVENOUS | Status: DC | PRN
  Administered 2015-08-24: 120 mg via INTRAVENOUS

## 2015-08-24 MED ORDER — SCOPOLAMINE 1 MG/3DAYS TD PT72
1.0000 | MEDICATED_PATCH | Freq: Once | TRANSDERMAL | Status: DC
  Administered 2015-08-24: 1.5 mg via TRANSDERMAL

## 2015-08-24 MED ORDER — KETOROLAC TROMETHAMINE 30 MG/ML IJ SOLN
INTRAMUSCULAR | Status: AC
  Filled 2015-08-24: qty 1

## 2015-08-24 MED ORDER — OXYCODONE-ACETAMINOPHEN 7.5-325 MG PO TABS: 1.0000 | ORAL_TABLET | ORAL | Status: DC | PRN

## 2015-08-24 MED ORDER — ARTIFICIAL TEARS OP OINT
TOPICAL_OINTMENT | OPHTHALMIC | Status: AC
  Filled 2015-08-24: qty 3.5

## 2015-08-24 MED ORDER — ROPIVACAINE HCL 5 MG/ML IJ SOLN
INTRAMUSCULAR | Status: AC
  Filled 2015-08-24: qty 30

## 2015-08-24 MED ORDER — DEXAMETHASONE SODIUM PHOSPHATE 10 MG/ML IJ SOLN
INTRAMUSCULAR | Status: DC | PRN
  Administered 2015-08-24: 4 mg via INTRAVENOUS

## 2015-08-24 MED ORDER — GLYCOPYRROLATE 0.2 MG/ML IJ SOLN
INTRAMUSCULAR | Status: DC | PRN
  Administered 2015-08-24: 0.6 mg via INTRAVENOUS

## 2015-08-24 MED ORDER — FENTANYL CITRATE (PF) 100 MCG/2ML IJ SOLN
INTRAMUSCULAR | Status: AC
  Administered 2015-08-24: 50 ug via INTRAVENOUS
  Filled 2015-08-24: qty 2

## 2015-08-24 MED ORDER — PROPOFOL 10 MG/ML IV BOLUS
INTRAVENOUS | Status: AC
  Filled 2015-08-24: qty 20

## 2015-08-24 MED ORDER — SODIUM CHLORIDE 0.9 % IJ SOLN
INTRAMUSCULAR | Status: AC
  Filled 2015-08-24: qty 50

## 2015-08-24 MED ORDER — PHENYLEPHRINE HCL 10 MG/ML IJ SOLN
INTRAMUSCULAR | Status: DC | PRN
  Administered 2015-08-24: 40 ug via INTRAVENOUS

## 2015-08-24 MED ORDER — LIDOCAINE HCL (CARDIAC) 20 MG/ML IV SOLN
INTRAVENOUS | Status: DC | PRN
  Administered 2015-08-24: 100 mg via INTRAVENOUS

## 2015-08-24 MED ORDER — ROCURONIUM BROMIDE 100 MG/10ML IV SOLN
INTRAVENOUS | Status: DC | PRN
  Administered 2015-08-24: 40 mg via INTRAVENOUS

## 2015-08-24 MED ORDER — ONDANSETRON HCL 4 MG/2ML IJ SOLN
INTRAMUSCULAR | Status: AC
  Filled 2015-08-24: qty 2

## 2015-08-24 MED ORDER — MIDAZOLAM HCL 2 MG/2ML IJ SOLN
INTRAMUSCULAR | Status: AC
  Filled 2015-08-24: qty 2

## 2015-08-24 MED ORDER — METHYLENE BLUE 1 % INJ SOLN
INTRAMUSCULAR | Status: AC
  Filled 2015-08-24: qty 10

## 2015-08-24 MED ORDER — ROCURONIUM BROMIDE 100 MG/10ML IV SOLN
INTRAVENOUS | Status: AC
  Filled 2015-08-24: qty 1

## 2015-08-24 MED FILL — OXYCODONE-APAP 7.5/325MG: 7.5-325 | 3 days supply | Qty: 20 | Fill #0

## 2015-08-24 SURGICAL SUPPLY — 67 items
APPLICATOR COTTON TIP 6IN STRL (MISCELLANEOUS) ×4 IMPLANT
BAG SPEC RTRVL LRG 6X4 10 (ENDOMECHANICALS) ×3
BARRIER ADHS 3X4 INTERCEED (GAUZE/BANDAGES/DRESSINGS) ×4 IMPLANT
BRR ADH 4X3 ABS CNTRL BYND (GAUZE/BANDAGES/DRESSINGS) ×3
CABLE HIGH FREQUENCY MONO STRZ (ELECTRODE) IMPLANT
CANNULA SEAL DVNC (CANNULA) ×1 IMPLANT
CANNULA SEALS DA VINCI (CANNULA) ×1
CATH ROBINSON RED A/P 16FR (CATHETERS) ×2 IMPLANT
CLOTH BEACON ORANGE TIMEOUT ST (SAFETY) ×4 IMPLANT
CONT PATH 16OZ SNAP LID 3702 (MISCELLANEOUS) ×4 IMPLANT
COVER BACK TABLE 60X90IN (DRAPES) ×8 IMPLANT
COVER TIP SHEARS 8 DVNC (MISCELLANEOUS) ×3 IMPLANT
COVER TIP SHEARS 8MM DA VINCI (MISCELLANEOUS) ×1
DECANTER SPIKE VIAL GLASS SM (MISCELLANEOUS) ×4 IMPLANT
DEFOGGER SCOPE WARMER CLEARIFY (MISCELLANEOUS) ×4 IMPLANT
DRSG COVADERM PLUS 2X2 (GAUZE/BANDAGES/DRESSINGS) ×16 IMPLANT
DRSG OPSITE POSTOP 3X4 (GAUZE/BANDAGES/DRESSINGS) ×4 IMPLANT
DURAPREP 26ML APPLICATOR (WOUND CARE) ×4 IMPLANT
ELECT REM PT RETURN 9FT ADLT (ELECTROSURGICAL) ×4
ELECTRODE REM PT RTRN 9FT ADLT (ELECTROSURGICAL) ×3 IMPLANT
GAUZE VASELINE 3X9 (GAUZE/BANDAGES/DRESSINGS) IMPLANT
GLOVE BIO SURGEON STRL SZ 6.5 (GLOVE) ×12 IMPLANT
GLOVE BIOGEL PI IND STRL 7.0 (GLOVE) ×18 IMPLANT
GLOVE BIOGEL PI INDICATOR 7.0 (GLOVE) ×6
GOWN STRL REUS W/TWL LRG LVL3 (GOWN DISPOSABLE) ×12 IMPLANT
IV STOPCOCK 4 WAY 40  W/Y SET (IV SOLUTION)
IV STOPCOCK 4 WAY 40 W/Y SET (IV SOLUTION) IMPLANT
KIT ACCESSORY DA VINCI DISP (KITS) ×1
KIT ACCESSORY DVNC DISP (KITS) ×3 IMPLANT
LEGGING LITHOTOMY PAIR STRL (DRAPES) ×2 IMPLANT
LIQUID BAND (GAUZE/BANDAGES/DRESSINGS) ×6 IMPLANT
MANIPULATOR UTERINE 4.5 ZUMI (MISCELLANEOUS) IMPLANT
NEEDLE HYPO 22GX1.5 SAFETY (NEEDLE) IMPLANT
PACK LAPAROSCOPY BASIN (CUSTOM PROCEDURE TRAY) ×4 IMPLANT
PACK ROBOT WH (CUSTOM PROCEDURE TRAY) ×4 IMPLANT
PACK ROBOTIC GOWN (GOWN DISPOSABLE) ×4 IMPLANT
PAD PREP 24X48 CUFFED NSTRL (MISCELLANEOUS) ×8 IMPLANT
PAD TRENDELENBURG POSITION (MISCELLANEOUS) ×4 IMPLANT
PENCIL BUTTON HOLSTER BLD 10FT (ELECTRODE) ×4 IMPLANT
POUCH SPECIMEN RETRIEVAL 10MM (ENDOMECHANICALS) ×2 IMPLANT
SEALER TISSUE G2 CVD JAW 35 (ENDOMECHANICALS) IMPLANT
SEALER TISSUE G2 CVD JAW 45CM (ENDOMECHANICALS) IMPLANT
SET CYSTO W/LG BORE CLAMP LF (SET/KITS/TRAYS/PACK) IMPLANT
SET IRRIG TUBING LAPAROSCOPIC (IRRIGATION / IRRIGATOR) ×6 IMPLANT
SET TRI-LUMEN FLTR TB AIRSEAL (TUBING) ×2 IMPLANT
SLEEVE XCEL OPT CAN 5 100 (ENDOMECHANICALS) IMPLANT
SUT MNCRL AB 4-0 PS2 18 (SUTURE) ×8 IMPLANT
SUT VIC AB 0 CT2 27 (SUTURE) ×4 IMPLANT
SUT VIC AB 4-0 PS2 27 (SUTURE) ×4 IMPLANT
SUT VICRYL 0 UR6 27IN ABS (SUTURE) ×6 IMPLANT
SYR 50ML LL SCALE MARK (SYRINGE) IMPLANT
SYSTEM CONVERTIBLE TROCAR (TROCAR) IMPLANT
TIP UTERINE 5.1X6CM LAV DISP (MISCELLANEOUS) IMPLANT
TIP UTERINE 6.7X10CM GRN DISP (MISCELLANEOUS) IMPLANT
TIP UTERINE 6.7X6CM WHT DISP (MISCELLANEOUS) IMPLANT
TIP UTERINE 6.7X8CM BLUE DISP (MISCELLANEOUS) ×2 IMPLANT
TOWEL OR 17X24 6PK STRL BLUE (TOWEL DISPOSABLE) ×12 IMPLANT
TRAY FOLEY BAG SILVER LF 16FR (SET/KITS/TRAYS/PACK) ×2 IMPLANT
TRAY FOLEY CATH SILVER 14FR (SET/KITS/TRAYS/PACK) ×4 IMPLANT
TROCAR 12M 150ML BLUNT (TROCAR) ×4 IMPLANT
TROCAR BALLN 12MMX100 BLUNT (TROCAR) ×4 IMPLANT
TROCAR DISP BLADELESS 8 DVNC (TROCAR) ×3 IMPLANT
TROCAR DISP BLADELESS 8MM (TROCAR) ×1
TROCAR PORT AIRSEAL 5X120 (TROCAR) ×2 IMPLANT
TROCAR XCEL NON-BLD 11X100MML (ENDOMECHANICALS) IMPLANT
TROCAR XCEL NON-BLD 5MMX100MML (ENDOMECHANICALS) ×4 IMPLANT
WATER STERILE IRR 1000ML POUR (IV SOLUTION) ×12 IMPLANT

## 2015-08-24 NOTE — Op Note (Signed)
08/24/2015  9:38 AM  PATIENT:  Wanda [name]  35 y.o. female  PRE-OPERATIVE DIAGNOSIS:  Left Pelvic Pain, Left Hydrosalpinx  POST-OPERATIVE DIAGNOSIS:  Left pelvic pain, left hyrosalpinx, Miminal Pelvic Endometriosis, Omental and Left Para-tubal Adhesions.  PROCEDURE:  Procedure(s): ROBOTIC LAPAROSCOPIC UNILATERAL LEFT SALPINGECTOMY with  Lysis of  Omental and Left Para Tubal Adhesions and Cauterization of Endometriosis  SURGEON:  Surgeon(s): Princess Bruins, MD  ASSISTANTS: Hester Mates  ANESTHESIA:   general   PROCEDURE:  Under general anesthesia with endotracheal intubation the patient is in lithotomy position. She is prepped with ChloraPrep on the abdomen and with Betadine on the suprapubic, vulvar and vaginal areas. She is draped as usual. Vaginally we put in place a #8 Rumi with the small Koe-Ring without difficulty. The Foley is put in place in the bladder.  Abdomen only, we infiltrate Marcaine one quarter plane on the supraumbilical area and make a 1.5 cm incision with the scalpel the aponeurosis is opened with Mayo scissors under direct vision and the parietal peritoneum is opened bluntly with the finger. A pursestring stitch of Vicryl 0 is done on the aponeurosis. We inserted the [facility] at that level and created a pneumoperitoneum with CO2. The camera is inserted at that level and the abdominopelvic cavities are inspected.  Omental adhesions are present below the umbilical on the midline to the anterior wall.  The right tube and ovaries are absent.  The left ovary is normal in appearance and size.  The left tube presents a hydrosalpinx.  The distal end of the left tube is adherent to the left ovary.  2 small subserosal fibroids are present at the posterior fundus and left anterior uterus. Minimal pelvic endometriosis is present just distal to the left uterosacral ligament.  Given the findings, the decision is taken to proceed with robotic-assisted laparoscopy.      We used a  semicircular configuration for port placement with 1 robotic port on the right lower abdomen and one on the left lower abdomen, with the assistant port on the left upper side.  Each site was infiltrated with Marcaine one quarter plain and a small incision was done with the scalpel at each site.  All ports were inserted under direct vision.  After placing the patient in deep Trendelenburg the robot was docked on the right side. Robotic instruments were inserted under direct vision with the EndoShears scissor in the right arm and the PK in the left arm.  We went to the console.      All previously described findings were confirmed and pictures were taken.  We started with lysis of adhesions between the omentum and the anterior wall of the abdomen.  We then proceeded with lysis of adhesions between the left tube and the left ovary.  We then cauterized in sectioned the left mesosalpinx.  We cauterized in section the tube very close to the uterus at the cornua.  Hemostasis was adequate at all levels.  We then cauterized the cornua of the right tube to avoid possible implantation at that level with eventual in vitro fertilization.  Both ureters were in normal anatomic position.  The left ureter was far from the endometriotic lesion.That small superficial lesion of endometriosis was cauterized just distal to the left uterosacral ligament.  Pictures were taken at the end of the procedure.  Hemostasis was adequate at all levels.  The left tube was safely placed in the posterior cul-de-sac.  All robotic instruments were removed.  The robot was undocked.  We went to laparoscopy time.      We used a 8 mm camera in the left port and inserted an Endobag through the supraumbilical port.  The left tube was easily placed in the Endobag and removed from the abdominopelvic cavity.  We then irrigated and suctioned the abdominopelvic cavities.  Reconfirmed good hemostasis.  Pictures were taken. Bupivacaine was poured in the  abdominopelvic cavity.  All laparoscopic instruments were removed.  The CO2 was evacuated.  All incisions were closed with a Vicryl 4-0 in a subcuticular stitch.  Dermabond was applied on all incisions.  The instruments were removed from the vagina.  The patient was brought to recovery room in good and stable status.  ESTIMATED BLOOD LOSS:  5 cc   Intake/Output Summary (Last 24 hours) at 08/24/15 [id number] Last data filed at 08/24/15 0926  Gross per 24 hour  Intake   1200 ml  Output    155 ml  Net   1045 ml     BLOOD ADMINISTERED:none   LOCAL MEDICATIONS USED:  MARCAINE and Bupivicaine  SPECIMEN:  Source of Specimen:  Left Tube  DISPOSITION OF SPECIMEN:  PATHOLOGY  COUNTS:  YES  PLAN OF CARE: Transfer to PACU  Princess Bruins MD  08/24/2015 at 9:38 am

## 2015-08-24 NOTE — H&P (Signed)
TAKYLA KUCHERA is an 35 y.o. female G0 married  RP:  Lt pelvic pain with Lt Hydrosalpinx  Pertinent Gynecological History: Menses: flow is moderate Contraception: none  (S/P RSO, L Hydrosalpinx) Blood transfusions: none Sexually transmitted diseases: no past history Previous GYN Procedures: Rt SO  Last pap: normal OB History:  G0  Menstrual History:  No LMP recorded.    Past Medical History  Diagnosis Date  . Goiter     recent dx 06/2015 MD is watching, no meds   . Diabetes mellitus     type 2  . Depression     hx - no meds currently  . Anxiety     Hx - no current meds  . Anemia     Hx    Past Surgical History  Procedure Laterality Date  . Oophrectomy      right  . Inner ear surgery Right     rupture eardrum    Family History  Problem Relation Age of Onset  . Cancer Father   . Multiple myeloma Father   . Hypertension Mother   . Diabetes Neg Hx   . Thyroid disease Neg Hx     Social History:  reports that she has never smoked. She has never used smokeless tobacco. She reports that she drinks alcohol. She reports that she does not use illicit drugs.  Allergies: No Known Allergies  Prescriptions prior to admission  Medication Sig Dispense Refill Last Dose  . Dulaglutide (TRULICITY) 1.5 KY/7.0WC SOPN Inject 1.5 mg into the skin once a week. Reported on 08/07/2015   08/22/2015  . Blood Glucose Monitoring Suppl (BAYER CONTOUR NEXT MONITOR) w/Device KIT   0 Taking  . canagliflozin (INVOKANA) 300 MG TABS tablet    08/22/2015    ROS Neg  Blood pressure 110/81, temperature 98.3 F (36.8 C), temperature source Oral, resp. rate 16, SpO2 100 %. Physical Exam  Results for orders placed or performed during the hospital encounter of 08/24/15 (from the past 24 hour(s))  Pregnancy, urine     Status: None   Collection Time: 08/24/15  6:00 AM  Result Value Ref Range   Preg Test, Ur NEGATIVE NEGATIVE  Glucose, capillary     Status: Abnormal   Collection Time: 08/24/15   6:25 AM  Result Value Ref Range   Glucose-Capillary 120 (H) 65 - 99 mg/dL   Comment 1 Document in Chart    Comment 2 Call MD NNP PA CNM     No results found.  Assessment/Plan: Lt pelvic pain with Lt Hydrosalpinx for LPS Lt Salpingectomy, possible LOA, possible Robotic assistance.  Surgery and risks reviewed.  Jenny Omdahl,MARIE-LYNE 08/24/2015, 7:24 AM

## 2015-08-24 NOTE — Anesthesia Postprocedure Evaluation (Signed)
Anesthesia Post Note  Patient: Wanda [name]  Procedure(s) Performed: Procedure(s) (LRB): LAPAROSCOPIC Left  SALPINGECTOMY with  Lysis of  Omental and Left Para Tubal Adhesions (Left) ROBOTIC ASSISTED LAPAROSCOPIC  Left SALPINGECTOMY  Patient location during evaluation: PACU Anesthesia Type: General Level of consciousness: awake and alert Pain management: pain level controlled Vital Signs Assessment: post-procedure vital signs reviewed and stable Respiratory status: spontaneous breathing, nonlabored ventilation, respiratory function stable and patient connected to nasal cannula oxygen Cardiovascular status: blood pressure returned to baseline and stable Postop Assessment: no signs of nausea or vomiting Anesthetic complications: no     Last Vitals:  Filed Vitals:   08/24/15 1120 08/24/15 1223  BP:  121/80  Pulse: 115 98  Temp: 36.7 C   Resp: 15 18    Last Pain:  Filed Vitals:   08/24/15 1227  PainSc: 3    Pain Goal: Patients Stated Pain Goal: 2 (08/24/15 0617)               Abdirizak Richison J

## 2015-08-24 NOTE — Discharge Instructions (Addendum)
Salpingectomy, Care After Refer to this sheet in the next few weeks. These instructions provide you with information on caring for yourself after your procedure. Your health care provider may also give you more specific instructions. Your treatment has been planned according to current medical practices, but problems sometimes occur. Call your health care provider if you have any problems or questions after your procedure. WHAT TO EXPECT AFTER THE PROCEDURE After your procedure, it is typical to have the following:  Abdominal pain that can be controlled with pain medicine.  Vaginal spotting.  Tiredness. HOME CARE INSTRUCTIONS  Get plenty of rest and sleep.  Only take over-the-counter or prescription medicines as directed by your health care provider.  Keep incision areas clean and dry. Remove or change any bandages (dressings) only as directed by your health care provider.  You may resume your regular diet. Eat a well-balanced diet.  Drink enough fluids to keep your urine clear or pale yellow.  Limit exercise and activities as directed by your health care provider. Do not lift anything heavier than 5 lb (2.3 kg) until your health care provider approves.  Do not drive until your health care provider approves.  Do not have sexual intercourse until your health care provider says it is okay.  Take your temperature twice a day for the first week. Write those temperatures down.  Follow up with your health care provider as directed. SEEK MEDICAL CARE IF:  You have pain when you urinate.  You see pus coming out of the incision, or the incision is separating.  You have increasing abdominal pain.  You have swelling or redness in the incision area.  You develop a rash.  You feel lightheaded.  You have pain that is not controlled with medicine. SEEK IMMEDIATE MEDICAL CARE IF:  You develop a fever.  You have increasing abdominal pain.  You develop chest or leg pain.  You  develop shortness of breath.  You pass out.   This information is not intended to replace advice given to you by your health care provider. Make sure you discuss any questions you have with your health care provider.   Document Released: 05/31/2010 Document Revised: 03/17/2014 Document Reviewed: 08/18/2012 Elsevier Interactive Patient Education 2016 [place] INSTRUCTIONS: Laparoscopy  The following instructions have been prepared to help you care for yourself upon your return home today.  Wound care:  Do not get the incision wet for the first 24 hours. The incision should be kept clean and dry.  The Band-Aids or dressings may be removed the day after surgery.  Should the incision become sore, red, and swollen after the first week, check with your doctor.  Personal hygiene:  Shower the day after your procedure.  Activity and limitations:  Do NOT drive or operate any equipment today.  Do NOT lift anything more than 15 pounds for 2-3 weeks after surgery.  Do NOT rest in bed all day.  Walking is encouraged. Walk each day, starting slowly with 5-minute walks 3 or 4 times a day. Slowly increase the length of your walks.  Walk up and down stairs slowly.  Do NOT do strenuous activities, such as golfing, playing tennis, bowling, running, biking, weight lifting, gardening, mowing, or vacuuming for 2-4 weeks. Ask your doctor when it is okay to start.  Diet: Eat a light meal as desired this evening. You may resume your usual diet tomorrow.  Return to work: This is dependent on the type of work you do. For the  most part you can return to a desk job within a week of surgery. If you are more active at work, please discuss this with your doctor.  What to expect after your surgery: You may have a slight burning sensation when you urinate on the first day. You may have a very small amount of blood in the urine. Expect to have a small amount of vaginal discharge/light bleeding  for 1-2 weeks. It is not unusual to have abdominal soreness and bruising for up to 2 weeks. You may be tired and need more rest for about 1 week. You may experience shoulder pain for 24-72 hours. Lying flat in bed may relieve it.  Call your doctor for any of the following:  Develop a fever of 100.4 or greater  Inability to urinate 6 hours after discharge from hospital  Severe pain not relieved by pain medications  Persistent of heavy bleeding at incision site  Redness or swelling around incision site after a week  Increasing nausea or vomiting  Patient Signature________________________________________ Nurse Signature_________________________________________

## 2015-08-24 NOTE — Discharge Summary (Signed)
  Physician Discharge Summary  Patient ID: Wanda [name] MRN: [id number] DOB/AGE: 1980-04-21 35 y.o.  Admit date: 08/24/2015 Discharge date: 08/24/2015  Admission Diagnoses: Left Pelvic Pain, Left Hydrosalpinx  Discharge Diagnoses: Left Pelvic Pain, Left Hydrosalpinx, Omental and Para-tubal Adhesions, Minimal Pelvic Endometriosis        Active Problems:   * No active hospital problems. *   Discharged Condition: good  Hospital Course: Outpatient  Consults: None  Treatments: surgery: Robotic Left Salpingectomy, Lysis of Adhesions, Cauterization of Endometriosis  Disposition: 01-Home or Self Care     Medication List    TAKE these medications        BAYER CONTOUR NEXT MONITOR w/Device Kit     INVOKANA 300 MG Tabs tablet  Generic drug:  canagliflozin     oxyCODONE-acetaminophen 7.5-325 MG tablet  Commonly known as:  PERCOCET  Take 1 tablet by mouth every 4 (four) hours as needed for severe pain.     TRULICITY 1.5 KG/4.0NU Sopn  Generic drug:  Dulaglutide  Inject 1.5 mg into the skin once a week. Reported on 08/07/2015           Follow-up Information    Follow up with Kaci Dillie,MARIE-LYNE, MD In 3 weeks.   Specialty:  Obstetrics and Gynecology   Contact information:   [address] [place] [address] [phone]       Signed: Princess Bruins, MD 08/24/2015, 10:12 AM

## 2015-08-24 NOTE — Anesthesia Procedure Notes (Signed)
Procedure Name: Intubation Date/Time: 08/24/2015 7:44 AM Performed by: Raenette Rover Pre-anesthesia Checklist: Patient identified, Suction available, Emergency Drugs available and Patient being monitored Patient Re-evaluated:Patient Re-evaluated prior to inductionOxygen Delivery Method: Circle system utilized Preoxygenation: Pre-oxygenation with 100% oxygen Intubation Type: IV induction Ventilation: Mask ventilation without difficulty Laryngoscope Size: Mac and 3 Grade View: Grade II Tube type: Oral Tube size: 7.0 mm Number of attempts: 1 Airway Equipment and Method: Stylet Placement Confirmation: breath sounds checked- equal and bilateral,  ETT inserted through vocal cords under direct vision,  positive ETCO2 and CO2 detector Secured at: 21 cm Tube secured with: Tape Dental Injury: Teeth and Oropharynx as per pre-operative assessment

## 2015-08-24 NOTE — Transfer of Care (Signed)
Immediate Anesthesia Transfer of Care Note  Patient: TEYONNA KABACINSKI  Procedure(s) Performed: Procedure(s): LAPAROSCOPIC UNILATERAL SALPINGECTOMY with  Lysis of  Omental and Left Para Tubal Adhesions (Left) ROBOTIC ASSISTED LAPAROSCOPIC  SALPINGECTOMY  Patient Location: PACU  Anesthesia Type:General  Level of Consciousness: awake, alert , oriented and patient cooperative  Airway & Oxygen Therapy: Patient Spontanous Breathing and Patient connected to nasal cannula oxygen  Post-op Assessment: Report given to RN and Post -op Vital signs reviewed and stable  Post vital signs: Reviewed and stable  Last Vitals:  Filed Vitals:   08/24/15 0617  BP: 110/81  Temp: 36.8 C  Resp: 16    Last Pain: There were no vitals filed for this visit.    Patients Stated Pain Goal: 2 ([id number])  Complications: No apparent anesthesia complications

## 2015-08-26 ENCOUNTER — Encounter (HOSPITAL_COMMUNITY): Payer: Self-pay | Admitting: Obstetrics & Gynecology

## 2015-08-27 ENCOUNTER — Other Ambulatory Visit: Payer: Self-pay | Admitting: *Deleted

## 2015-08-27 ENCOUNTER — Encounter: Payer: 59 | Admitting: *Deleted

## 2015-08-27 DIAGNOSIS — E1165 Type 2 diabetes mellitus with hyperglycemia: Secondary | ICD-10-CM | POA: Diagnosis not present

## 2015-08-27 NOTE — Patient Outreach (Addendum)
Spoke with Wanda [name] by mobile phone to complete transition of care call. She had outpatient surgery on 6/16 Robotic Left Salpingectomy, Lysis of Adhesions, Cauterization of Endometriosis. This surgery is in preparation for future  in vitro fertilization.  Wanda [name] says she is doing well and managing pain by alternating Percocet with Aleve. She says her surgical stab wounds looks unremarkable. She said her fasting blood sugar today was 129. She saw her primary care MD today and she said an Hgb A1C was drawn but she does not know the result yet.  Will plan to call her again on Friday 6/23 as she plans to return to work on 6/26. Barrington Ellison RN,CCM,CDE [place] Management Coordinator Link To Wellness Office Phone [phone] Office Fax [phone]

## 2015-08-27 NOTE — Patient Outreach (Signed)
Completed transition of care call. See transition of care template for details. Barrington Ellison RN,CCM,CDE [place] Management Coordinator Link To Wellness Office Phone [phone] Office Fax [phone]

## 2015-08-31 ENCOUNTER — Other Ambulatory Visit: Payer: Self-pay | Admitting: *Deleted

## 2015-08-31 NOTE — Patient Outreach (Signed)
Spoke with Gretchen via her mobile phone number to assess her ongoing recovery from outpatient surgery on 6/16, Robotic Left Salpingectomy, Lysis of Adhesions, Cauterization of Endometriosis. This surgery is in preparation for future in vitro fertilization.  Wanda [name] says she was doing well until today. She says she has nausea, vomiting and diarrhea but no fever or abdominal pain. She denies sick person exposure. She says she has not been able to take any of her medications today due to nausea. She was advised to push fluids and check her blood sugar more often. Reviewed how to treat a low blood sugar and to call MD if two CBGs are >200.  She said her primary care MD's office called her and told her the  Hgb A1C that was drawn yesterday was 7.4% so she is pleased with the improvement as her previous Hgb A1C was 8.6%. Congratulated her on the hard work she's id doing to improve her glycemic control.  Discussed options of  e- visits or phone or video visits if her GI symptoms continue.Elease Etienne her the directions on how to access all three. Will plan to call her again on Monday on 6/26. Barrington Ellison RN,CCM,CDE [place] Management Coordinator Link To Wellness Office Phone [phone] Office Fax [phone]

## 2015-09-03 ENCOUNTER — Other Ambulatory Visit: Payer: Self-pay | Admitting: *Deleted

## 2015-09-03 NOTE — Patient Outreach (Signed)
Wanda [name] responded to e-mail sent to her requesting update to assess her ongoing recovery from outpatient surgery on 6/16, Robotic Left Salpingectomy, Lysis of Adhesions, Cauterization of Endometriosis. She states her Gi symptoms she was experiencing on Friday 6/23 have resolved and she is back at work today although she says she has to get up frequently and move around to stay comfortable. Have requested she update this RNCM after she sees Dr Dwyane Dee in July for DM. Barrington Ellison RN,CCM,CDE [place] Management Coordinator Link To Wellness Office Phone [phone] Office Fax [phone]

## 2015-09-07 ENCOUNTER — Encounter: Payer: Self-pay | Admitting: Endocrinology

## 2015-09-14 DIAGNOSIS — N7011 Chronic salpingitis: Secondary | ICD-10-CM | POA: Diagnosis not present

## 2015-09-21 DIAGNOSIS — E1165 Type 2 diabetes mellitus with hyperglycemia: Secondary | ICD-10-CM | POA: Diagnosis not present

## 2015-09-21 DIAGNOSIS — E1142 Type 2 diabetes mellitus with diabetic polyneuropathy: Secondary | ICD-10-CM | POA: Diagnosis not present

## 2015-10-01 ENCOUNTER — Ambulatory Visit: Payer: 59 | Admitting: Endocrinology

## 2015-10-01 MED FILL — TRULICITY 1.5 MG/0.5 ML PEN: 1.5 | 28 days supply | Qty: 2 | Fill #0

## 2015-10-18 DIAGNOSIS — E118 Type 2 diabetes mellitus with unspecified complications: Secondary | ICD-10-CM | POA: Diagnosis not present

## 2015-10-18 DIAGNOSIS — Z319 Encounter for procreative management, unspecified: Secondary | ICD-10-CM | POA: Diagnosis not present

## 2015-10-26 DIAGNOSIS — Z3141 Encounter for fertility testing: Secondary | ICD-10-CM | POA: Diagnosis not present

## 2015-10-26 DIAGNOSIS — Z319 Encounter for procreative management, unspecified: Secondary | ICD-10-CM | POA: Diagnosis not present

## 2015-10-26 DIAGNOSIS — E288 Other ovarian dysfunction: Secondary | ICD-10-CM | POA: Diagnosis not present

## 2015-10-29 DIAGNOSIS — N8500 Endometrial hyperplasia, unspecified: Secondary | ICD-10-CM | POA: Diagnosis not present

## 2015-11-01 MED FILL — PROGESTERONE OIL 50 MG/ML V: 50 | 30 days supply | Qty: 30 | Fill #0

## 2015-11-01 MED FILL — SURE COMFORT 0.5 ML SYRINGE: 30G X 1/2" | 20 days supply | Qty: 20 | Fill #0

## 2015-11-01 MED FILL — BD NEEDLES 30GX0.5: 30G X 1/2" | 20 days supply | Qty: 20 | Fill #0

## 2015-11-01 MED FILL — METHYLPREDNISOLONE 8 MG TAB: 8 | 4 days supply | Qty: 8 | Fill #0

## 2015-11-01 MED FILL — DOXYCYCLINE HYCLATE 100 MG: 100 | 20 days supply | Qty: 40 | Fill #0

## 2015-11-01 MED FILL — LEUPROLIDE 2WK 1 MG/0.2 ML: 1 | 14 days supply | Qty: 1 | Fill #0

## 2015-11-01 MED FILL — BD 3 ML SYRINGE 18GX1-1/2: 18G X 1-1/2 | 30 days supply | Qty: 50 | Fill #0

## 2015-11-01 MED FILL — GONAL-F 1,050 UNITS VIAL: 1050 | 30 days supply | Qty: 2 | Fill #0

## 2015-11-01 MED FILL — PREGNYL 10,000 UNITS VIAL: 10000 | 30 days supply | Qty: 1 | Fill #0

## 2015-11-01 MED FILL — GONAL-F RFF REDI-JECT 450 U: 450 | 30 days supply | Qty: 1 | Fill #0

## 2015-11-01 MED FILL — SM ALCOHOL 70% PREP PADS: 90 days supply | Qty: 100 | Fill #0

## 2015-11-01 MED FILL — MENOPUR 75 UNIT VIAL: 75 | 10 days supply | Qty: 10 | Fill #0 | Status: TO

## 2015-11-01 MED FILL — BD NEEDLES 22GX1.5: 22G X 1-1/2 | 30 days supply | Qty: 30 | Fill #0

## 2015-11-02 MED FILL — SYNAREL 2 MG/ML NASAL SPRAY: 2 | 30 days supply | Qty: 8 | Fill #0

## 2015-11-08 DIAGNOSIS — Z3183 Encounter for assisted reproductive fertility procedure cycle: Secondary | ICD-10-CM | POA: Diagnosis not present

## 2015-11-13 DIAGNOSIS — Z3183 Encounter for assisted reproductive fertility procedure cycle: Secondary | ICD-10-CM | POA: Diagnosis not present

## 2015-11-16 DIAGNOSIS — Z3183 Encounter for assisted reproductive fertility procedure cycle: Secondary | ICD-10-CM | POA: Diagnosis not present

## 2015-11-16 DIAGNOSIS — Z113 Encounter for screening for infections with a predominantly sexual mode of transmission: Secondary | ICD-10-CM | POA: Diagnosis not present

## 2015-11-16 MED FILL — MENOPUR 75 UNIT VIAL: 75 | 10 days supply | Qty: 10 | Fill #0

## 2015-11-19 DIAGNOSIS — Z3183 Encounter for assisted reproductive fertility procedure cycle: Secondary | ICD-10-CM | POA: Diagnosis not present

## 2015-11-21 DIAGNOSIS — Z3183 Encounter for assisted reproductive fertility procedure cycle: Secondary | ICD-10-CM | POA: Diagnosis not present

## 2015-11-21 MED FILL — PROMETHAZINE 12.5 MG TABLET: 12.5 | 2 days supply | Qty: 10 | Fill #0

## 2015-11-21 MED FILL — OXYCODONE/APAP 5-325: 5-325 | 3 days supply | Qty: 15 | Fill #0

## 2015-11-24 DIAGNOSIS — Z3183 Encounter for assisted reproductive fertility procedure cycle: Secondary | ICD-10-CM | POA: Diagnosis not present

## 2015-11-29 DIAGNOSIS — Z3183 Encounter for assisted reproductive fertility procedure cycle: Secondary | ICD-10-CM | POA: Diagnosis not present

## 2015-12-07 ENCOUNTER — Encounter: Payer: Self-pay | Admitting: *Deleted

## 2015-12-07 ENCOUNTER — Other Ambulatory Visit: Payer: Self-pay | Admitting: *Deleted

## 2015-12-07 NOTE — Patient Outreach (Signed)
[facility] ([facility]) Care Management   12/07/2015  Wanda [name] 12/12/1980 [id number]  Wanda [name] is an 35 y.o. female who presents to the [address] [facility] Care Management office for routine Link To Wellness follow up for self management assistance with Type II DM.  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 They were able to retrieve 16 eggs from her remaining one ovary , 10 were mature, so two were implanted, and the rest frozen.  She says she is not able to take any medicines for diabetes while she is undergoing in vitro fertilization so she has not been on any DM medicines since August. She says she is checking her CBG daily and most of the readings are in the low 200s. She says she walks everyday and is consistently adherent to a CHO controlled meal plan.  She says she will see her primary care MD on 12/18/15 and lab work will be done. She says her last Hgb A1C was done on 08/27/15 and it was 7.4%.  Objective:   Review of Systems  Constitutional: Negative.     Physical Exam  Constitutional: She is oriented to person, place, and time. She appears well-developed and well-nourished.  Neck: Thyromegaly present.    Respiratory: Effort normal.  Neurological: She is alert and oriented to person, place, and time.  Skin: Skin is warm and dry.  Psychiatric: She has a normal mood and affect. Her behavior is normal. Judgment and thought content normal.    Vitals:   12/07/15 0842  BP: 130/80   Filed Weights   12/07/15 0842  Weight: 176 lb 6.4 oz (80 kg)    Encounter Medications:   Outpatient Encounter Prescriptions as of 12/07/2015  Medication Sig Note  . aspirin EC 81 MG tablet Take 81 mg by mouth daily.   . Blood Glucose Monitoring Suppl (BAYER CONTOUR NEXT MONITOR) w/Device KIT  08/20/2015: Received from: External Pharmacy  . canagliflozin (INVOKANA) 300 MG TABS tablet  08/20/2015: Received from: [facility]  . Dulaglutide (TRULICITY) 1.5 MG/0.5ML SOPN Inject 1.5 mg into the skin once a week. Reported on 08/07/2015 08/23/2015: Injection is on Wednesday weekly  . oxyCODONE-acetaminophen (PERCOCET) 7.5-325 MG tablet Take 1 tablet by mouth every 4 (four) hours as needed for severe pain. (Patient not taking: Reported on 12/07/2015)    No facility-administered encounter medications on file as of 12/07/2015.     Functional Status:   In your present state of health, do you have any difficulty performing the following activities: 12/07/2015 08/23/2015  Hearing? N N  Vision? N N  Difficulty concentrating or making decisions? N N  Walking or climbing stairs? N N  Dressing or bathing? N N  Doing errands, shopping? N N  Some recent data might be hidden    Fall/Depression Screening:    PHQ 2/9 Scores 07/04/2015 06/11/2015  PHQ - 2 Score 0 0    Assessment:   [employer] employee and Link To Wellness member with Type II DM with worsening glycemic control as evidenced by increased self monitored CBG  Values since stopping all DM medications due to fertility treatment.   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   Role Documenting the Problem One  Care Management Coordinator   Care Plan for Problem One  Active   [facility] Long Term Goal (31-90 days)  Improved glycemic control as evidenced by Hgb A1C<7.0% with >80% of home monitored blood sugars meeting target   [facility] Long Term Goal Start Date  08/07/2015   Interventions for Problem One Long Term Goal  discussed current in vitro fertility treatment including need to stop DM medications and manage blood sugar with diet and exercise, reviewed  the likely  need for insulin during pregnancy to keep tight control of blood sugars and reassured patient that insulin is FDA approved for use in pregnancy, advised her to speak with her care team about initiating insulin, reviewed CBG log and discussed non pharmaceutical strategies to move blood sugars toward target, reviewed upcoming appointment with Dr. Boyd on 10/10, will arrange for Link To Wellness follow up in December     RNCM to fax today's office visit note to Dr. Boyd. RNCM will meet quarterly and as needed with patient per Link To Wellness program guidelines to assist with Type II DM self-management and assess patient's progress toward mutually set goals.  Janet S. Hauser RN,CCM,CDE [facility] Care Management Coordinator Link To Wellness Office Phone [phone] Office Fax [phone]     

## 2015-12-12 DIAGNOSIS — E1142 Type 2 diabetes mellitus with diabetic polyneuropathy: Secondary | ICD-10-CM | POA: Diagnosis not present

## 2015-12-12 DIAGNOSIS — E1165 Type 2 diabetes mellitus with hyperglycemia: Secondary | ICD-10-CM | POA: Diagnosis not present

## 2015-12-14 MED FILL — LANTUS SOLOSTAR 100 UNITS/M: 100 | 75 days supply | Qty: 15 | Fill #0 | Status: TO

## 2015-12-14 MED FILL — MICROLET LANCETS: 90 days supply | Qty: 100 | Fill #0 | Status: TO

## 2015-12-14 MED FILL — CONTOUR NEXT STRIPS: 50 days supply | Qty: 50 | Fill #1 | Status: TO

## 2015-12-18 DIAGNOSIS — E119 Type 2 diabetes mellitus without complications: Secondary | ICD-10-CM | POA: Diagnosis not present

## 2015-12-18 MED FILL — BD PEN NDL MINI 31GX5MM: 31G X 5 MM | 90 days supply | Qty: 100 | Fill #0

## 2015-12-18 MED FILL — METFORMIN HCL ER 500 MG TAB: 500 | 30 days supply | Qty: 30 | Fill #0 | Status: TO

## 2015-12-20 DIAGNOSIS — O200 Threatened abortion: Secondary | ICD-10-CM | POA: Diagnosis not present

## 2015-12-20 DIAGNOSIS — Z3201 Encounter for pregnancy test, result positive: Secondary | ICD-10-CM | POA: Diagnosis not present

## 2015-12-25 MED FILL — BD NEEDLES 22GX1.5": 22G X 1-1/2 | 30 days supply | Qty: 30 | Fill #1

## 2015-12-25 MED FILL — BD NEEDLES 22GX1.5: 22G X 1-1/2 | 30 days supply | Qty: 30 | Fill #1

## 2015-12-25 MED FILL — BD 3 ML SYRINGE 18GX1-1/2": 18G X 1-1/2 | 30 days supply | Qty: 50 | Fill #1

## 2015-12-25 MED FILL — PROGESTERONE OIL 50 MG/ML V: 50 | 30 days supply | Qty: 30 | Fill #1 | Status: TO

## 2015-12-25 MED FILL — BD 3 ML SYRINGE 18GX1-1/2: 18G X 1-1/2 | 30 days supply | Qty: 50 | Fill #1

## 2015-12-27 DIAGNOSIS — O200 Threatened abortion: Secondary | ICD-10-CM | POA: Diagnosis not present

## 2016-01-02 DIAGNOSIS — O020 Blighted ovum and nonhydatidiform mole: Secondary | ICD-10-CM | POA: Diagnosis not present

## 2016-01-09 DIAGNOSIS — O021 Missed abortion: Secondary | ICD-10-CM | POA: Diagnosis not present

## 2016-01-10 MED FILL — MONO-LINYAH 28 TABLET: 0.25-35 | 28 days supply | Qty: 28 | Fill #0

## 2016-01-16 MED FILL — TRULICITY 1.5 MG/0.5 ML PEN: 1.5 | 28 days supply | Qty: 2 | Fill #1 | Status: TO

## 2016-01-30 DIAGNOSIS — Z794 Long term (current) use of insulin: Secondary | ICD-10-CM | POA: Diagnosis not present

## 2016-01-30 DIAGNOSIS — E119 Type 2 diabetes mellitus without complications: Secondary | ICD-10-CM | POA: Diagnosis not present

## 2016-02-04 DIAGNOSIS — N840 Polyp of corpus uteri: Secondary | ICD-10-CM | POA: Diagnosis not present

## 2016-02-04 DIAGNOSIS — Z3141 Encounter for fertility testing: Secondary | ICD-10-CM | POA: Diagnosis not present

## 2016-02-04 MED FILL — metroNIDAZOLE 500 MG TABS: 500 | 7 days supply | Qty: 14 | Fill #0

## 2016-02-06 DIAGNOSIS — O0289 Other abnormal products of conception: Secondary | ICD-10-CM | POA: Diagnosis not present

## 2016-02-06 DIAGNOSIS — N856 Intrauterine synechiae: Secondary | ICD-10-CM | POA: Diagnosis not present

## 2016-02-06 DIAGNOSIS — O032 Embolism following incomplete spontaneous abortion: Secondary | ICD-10-CM | POA: Diagnosis not present

## 2016-02-06 MED FILL — ESTRADIOL 0.1 MG PATCH: 0.1 | 72 days supply | Qty: 24 | Fill #0

## 2016-02-06 MED FILL — ESTRADIOL 2 MG TABLET: 2 | 60 days supply | Qty: 120 | Fill #0

## 2016-02-18 MED FILL — TRULICITY 1.5 MG/0.5 ML PEN: 1.5 | 28 days supply | Qty: 2 | Fill #0

## 2016-02-20 DIAGNOSIS — Z3183 Encounter for assisted reproductive fertility procedure cycle: Secondary | ICD-10-CM | POA: Diagnosis not present

## 2016-02-20 MED FILL — metroNIDAZOLE 500 MG TABS: 500 | 7 days supply | Qty: 14 | Fill #0

## 2016-02-29 DIAGNOSIS — Z3183 Encounter for assisted reproductive fertility procedure cycle: Secondary | ICD-10-CM | POA: Diagnosis not present

## 2016-03-04 MED FILL — METFORMIN HCL ER 500 MG TAB: 500 | 30 days supply | Qty: 30 | Fill #0

## 2016-03-04 MED FILL — MICROLET LANCETS: 90 days supply | Qty: 100 | Fill #0

## 2016-03-04 MED FILL — CONTOUR NEXT STRIPS: 50 days supply | Qty: 50 | Fill #0

## 2016-03-04 MED FILL — LANTUS SOLOSTAR 100 UNITS/M: 100 | 75 days supply | Qty: 15 | Fill #0

## 2016-07-08 ENCOUNTER — Other Ambulatory Visit: Payer: Self-pay | Admitting: *Deleted

## 2016-07-08 ENCOUNTER — Encounter: Payer: Self-pay | Admitting: *Deleted

## 2016-07-08 NOTE — Patient Outreach (Addendum)
[facility]) Care Management   07/08/2016  Wanda [name] 01-01-1981 [id number]  Wanda [name] is an 36 y.o. female who presents to the [place] Management office for routine Link To Wellness follow up for self management assistance with Type II DM and obesity.  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 Says she had another in vitro attempt in December that did not work. She says she and her husband are not sure if they are going to try in vitro again. They are also considering adoption. She says she is only taking Metformin for her diabetes because she is afraid of the side effects from the [place] (increased risk of LE amputations) and the Trulicity (thyroid cancer). She says she is checking her CBG 2-3 times weekly and her fasting blood sugar averages 125-130.  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 She says she is also taking courses on-line to get a associates degree in art with plans to pursue a public health degree through [employer].  She says she has not seen her endocrinologist since June of last year and she last saw her primary care provider in November because she was being seen by her fertility doctors very frequently while going through the in vitro process.  She says her last Hgb A1C was done in October and was 8.6%. She refused offer of POC Hgb A1C today.  Objective:   Review of Systems  Constitutional: Negative.     Physical Exam  Constitutional: She is oriented to person, place, and time. She appears well-developed and well-nourished.  Respiratory: Effort normal.  Neurological: She is alert and oriented to person, place, and time.  Skin: Skin is warm and dry.  Psychiatric: She has a normal mood and affect. Her behavior is normal.  Judgment and thought content normal.    Vitals:   07/08/16 1650  BP: 120/70   Filed Weights   07/08/16 1650  Weight: 187 lb (84.8 kg)    Encounter Medications:   Outpatient Encounter Prescriptions as of 07/08/2016  Medication Sig Note  . aspirin EC 81 MG tablet Take 81 mg by mouth daily.   . Blood Glucose Monitoring Suppl (BAYER CONTOUR NEXT MONITOR) w/Device KIT  08/20/2015: Received from: External Pharmacy  . metFORMIN (GLUCOPHAGE) 500 MG tablet Take by mouth 2 (two) times daily with a meal.    No facility-administered encounter medications on file as of 07/08/2016.     Functional Status:   In your present state of health, do you have any difficulty performing the following activities: 07/08/2016 12/07/2015  Hearing? N N  Vision? N N  Difficulty concentrating or making decisions? N N  Walking or climbing stairs? N N  Dressing or bathing? N N  Doing errands, shopping? N N  Preparing Food and eating ? N -  Using the Toilet? N -  In the past six months, have you accidently leaked urine? N -  Do you have problems with loss of bowel control? N -  Managing your Medications? N -  Managing your Finances? N -  Housekeeping or managing your Housekeeping? N -  Some recent data might be hidden    Fall/Depression Screening:    PHQ 2/9 Scores 07/08/2016 07/04/2015 06/11/2015  PHQ - 2 Score 0 0 0  Exception  Documentation Patient refusal - -    Assessment:   [employer] employee and Link To Wellness member with Type II DM with most recent Hgb A1C= 8.6% on 12/18/15.   Plan:  [facility] CM Care Plan Problem One        Most Recent Value   Care Plan Problem One  [employer] employee with Type II DM not meeting treatment target Hgb A1C of <7.0% as evidenced by most recent Hgb A1C= 8.6% on 12/18/15 , obese with body mass index= 34.3   Role Documenting the Problem One  Care Management [place] for Problem One  Active   [facility] Long Term Goal (31-90 days)  Improved glycemic control as  evidenced by Hgb A1C<8.0% at next assessment and evidence of weight loss or no weight gain at next assessment   [facility] Long Term Goal Start Date  07/08/16   Interventions for Problem One Long Term Goal  allowed Wanda [name] time to share unsuccessful in vitro experiences and emotional support provided, reviewed medications and medication adherence, discussed her concerns about the side effects of Trulicity and Invokana based on TV advertisements, reviewed CBG readings and reviewed targets, encouraged Wanda [name] to make follow up appointments with her primary care provider and endocrinologist,  will arrange for Link To Wellness follow up in August     RNCM to fax today's office visit note to Dr. Luciana Axe. RNCM will meet quarterly and as needed with patient per Link To Wellness program guidelines to assist with Type II DM self-management and assess patient's progress toward mutually set goals.  Barrington Ellison RN,CCM,CDE [place] Management Coordinator Link To Wellness Office Phone [phone] Office Fax [phone]

## 2016-07-25 DIAGNOSIS — Z3202 Encounter for pregnancy test, result negative: Secondary | ICD-10-CM | POA: Diagnosis not present

## 2016-07-25 DIAGNOSIS — N921 Excessive and frequent menstruation with irregular cycle: Secondary | ICD-10-CM | POA: Diagnosis not present

## 2016-07-28 DIAGNOSIS — Z3201 Encounter for pregnancy test, result positive: Secondary | ICD-10-CM | POA: Diagnosis not present

## 2016-07-28 DIAGNOSIS — D252 Subserosal leiomyoma of uterus: Secondary | ICD-10-CM | POA: Diagnosis not present

## 2016-07-28 DIAGNOSIS — N939 Abnormal uterine and vaginal bleeding, unspecified: Secondary | ICD-10-CM | POA: Diagnosis not present

## 2016-07-28 MED FILL — MONO-LINYAH 28 TABLET: 0.25-35 | 28 days supply | Qty: 28 | Fill #0

## 2016-08-13 ENCOUNTER — Encounter: Payer: Managed Care, Other (non HMO) | Admitting: Obstetrics & Gynecology

## 2016-08-21 MED FILL — NORG-ETHIN ESTRA 0.25-0.035: 0.25-35 | 28 days supply | Qty: 28 | Fill #1

## 2016-09-30 ENCOUNTER — Encounter: Payer: Self-pay | Admitting: Obstetrics & Gynecology

## 2016-09-30 ENCOUNTER — Ambulatory Visit (INDEPENDENT_AMBULATORY_CARE_PROVIDER_SITE_OTHER): Payer: 59 | Admitting: Obstetrics & Gynecology

## 2016-09-30 VITALS — BP 134/88 | Ht 62.0 in | Wt 186.0 lb

## 2016-09-30 DIAGNOSIS — Z0000 Encounter for general adult medical examination without abnormal findings: Secondary | ICD-10-CM | POA: Diagnosis not present

## 2016-09-30 DIAGNOSIS — Z01419 Encounter for gynecological examination (general) (routine) without abnormal findings: Secondary | ICD-10-CM | POA: Diagnosis not present

## 2016-09-30 DIAGNOSIS — Z1151 Encounter for screening for human papillomavirus (HPV): Secondary | ICD-10-CM

## 2016-09-30 DIAGNOSIS — E119 Type 2 diabetes mellitus without complications: Secondary | ICD-10-CM | POA: Diagnosis not present

## 2016-09-30 DIAGNOSIS — Z113 Encounter for screening for infections with a predominantly sexual mode of transmission: Secondary | ICD-10-CM | POA: Diagnosis not present

## 2016-09-30 DIAGNOSIS — N979 Female infertility, unspecified: Secondary | ICD-10-CM

## 2016-09-30 DIAGNOSIS — Z794 Long term (current) use of insulin: Secondary | ICD-10-CM | POA: Diagnosis not present

## 2016-09-30 DIAGNOSIS — H5213 Myopia, bilateral: Secondary | ICD-10-CM | POA: Diagnosis not present

## 2016-09-30 LAB — HEPATITIS C ANTIBODY: HCV Ab: NEGATIVE

## 2016-09-30 LAB — HEPATITIS B SURFACE ANTIGEN: Hepatitis B Surface Ag: NEGATIVE

## 2016-09-30 MED FILL — UNIFINE PENTIPS 31GX3/16: 31G X 5 MM | 90 days supply | Qty: 100 | Fill #0

## 2016-09-30 MED FILL — FREESTYLE LITE TEST STRIP: 90 days supply | Qty: 100 | Fill #0 | Status: TO

## 2016-09-30 MED FILL — UNIFINE PENTIPS 31GX3/16": 31G X 5 MM | 90 days supply | Qty: 100 | Fill #0

## 2016-09-30 MED FILL — LANTUS SOLOSTAR 100 UNITS/M: 100 | 34 days supply | Qty: 9 | Fill #0

## 2016-09-30 MED FILL — FREESTYLE LITE METER: 1 days supply | Qty: 1 | Fill #0

## 2016-09-30 MED FILL — METFORMIN HCL ER 500 MG TAB: 500 | 30 days supply | Qty: 30 | Fill #0

## 2016-09-30 MED FILL — FREESTYLE LANCETS: 90 days supply | Qty: 100 | Fill #0 | Status: TO

## 2016-09-30 NOTE — Patient Instructions (Signed)
1. Encounter for routine gynecological examination with Papanicolaou smear of cervix Normal Gyn exam.  Pap/HPV HR done.  Breasts wnl.  2. Primary female infertility Will f/u with Dr Carmela Rima for 3rd round of IVF.  Will proceed with Hyperstimulation again in the hope of better Embryos.  3. Screen for STD (sexually transmitted disease) Asymptomatic.  Gono-Chlam on Pap. - HIV antibody - RPR - Hepatitis C Antibody - Hepatitis B Surface AntiGEN  Charlisha, it was a pleasure to see you today!  I will inform you of your results as soon as available.

## 2016-09-30 NOTE — Progress Notes (Signed)
Wanda [name] Nov 30, 1980 [id number]   History:    36 y.o.  G0 married  RP:  Established patient presenting for annual gyn exam  HPI:  Primary infertility/Tubal factor/AMA 36 yo.  S/P Rt Oophorectomy/Bilateral Salpingectomy.  2 rounds of IVF with Dr Carmela Rima.  Had a Blighted Ovum/D+E after 1st round.  Intends to try again 11/2016 with Hyperstimulation/IVF with Dr Carmela Rima.  [facility] at [facility] mid 07/2016.  Hb 13.0, TSH wnl.  Had a Pelvic US at [facility] wnl per patient 07/2016.  Goiter followed by Dr Dwyane Dee, Bx benign.  Menses normal 08/2016 and 09/13/2016.  No pelvic pain.  No abnormal vaginal d/c.  Breasts wnl.  Past medical history,surgical history, family history and social history were all reviewed and documented in the EPIC chart.  Gynecologic History Patient's last menstrual period was 09/13/2016. Contraception: Bilateral Salpingectomy/Rt Oophorectomy Last Pap: 03/2015. Results were: normal per patient Last mammogram: Never.   Obstetric History OB History  No data available     ROS: A ROS was performed and pertinent positives and negatives are included in the history.  GENERAL: No fevers or chills. HEENT: No change in vision, no earache, sore throat or sinus congestion. NECK: No pain or stiffness. CARDIOVASCULAR: No chest pain or pressure. No palpitations. PULMONARY: No shortness of breath, cough or wheeze. GASTROINTESTINAL: No abdominal pain, nausea, vomiting or diarrhea, melena or bright red blood per rectum. GENITOURINARY: No urinary frequency, urgency, hesitancy or dysuria. MUSCULOSKELETAL: No joint or muscle pain, no back pain, no recent trauma. DERMATOLOGIC: No rash, no itching, no lesions. ENDOCRINE: No polyuria, polydipsia, no heat or cold intolerance. No recent change in weight. HEMATOLOGICAL: No anemia or easy bruising or bleeding. NEUROLOGIC: No headache, seizures, numbness, tingling or weakness. PSYCHIATRIC: No depression, no loss of interest in  normal activity or change in sleep pattern.     Exam:   BP 134/88   Ht 5\' 2"  (1.575 m)   Wt 186 lb (84.4 kg)   LMP 09/13/2016   BMI 34.02 kg/m   Body mass index is 34.02 kg/m.  General appearance : Well developed well nourished female. No acute distress HEENT: Eyes: no retinal hemorrhage or exudates,  Neck supple, trachea midline, no carotid bruits, no thyroidmegaly Lungs: Clear to auscultation, no rhonchi or wheezes, or rib retractions  Heart: Regular rate and rhythm, no murmurs or gallops Breast:Examined in sitting and supine position were symmetrical in appearance, no palpable masses or tenderness,  no skin retraction, no nipple inversion, no nipple discharge, no skin discoloration, no axillary or supraclavicular lymphadenopathy Abdomen: no palpable masses or tenderness, no rebound or guarding Extremities: no edema or skin discoloration or tenderness  Pelvic: Vulva normal  Bartholin, Urethra, Skene Glands: Within normal limits             Vagina: No gross lesions or discharge  Cervix: No gross lesions or discharge  Uterus  AV, normal size, shape and consistency, non-tender and mobile  Adnexa  Without masses or tenderness  Anus and perineum  normal   Assessment/Plan:  36 y.o. female for annual exam   1. Encounter for routine gynecological examination with Papanicolaou smear of cervix Normal Gyn exam.  Pap/HPV HR done.  Breasts wnl.  2. Primary female infertility Will f/u with Dr Carmela Rima for 3rd round of IVF.  Will proceed with Hyperstimulation again in the hope of better Embryos.  3. Screen for STD (sexually transmitted disease) Asymptomatic.  Gono-Chlam on Pap. - HIV antibody - RPR -  Hepatitis C Antibody - Hepatitis B Surface AntiGEN  Counseling on above issues >50% x 10 minutes  Princess Bruins MD, 2:18 PM 09/30/2016

## 2016-09-30 NOTE — Addendum Note (Signed)
Addended by: Thurnell Garbe A on: 09/30/2016 03:02 PM   Modules accepted: Orders

## 2016-10-01 LAB — PAP IG, CT-NG NAA, HPV HIGH-RISK
Chlamydia Probe Amp: NOT DETECTED
GC Probe Amp: NOT DETECTED
HPV DNA HIGH RISK: NOT DETECTED

## 2016-10-01 LAB — RPR

## 2016-10-01 LAB — HIV ANTIBODY (ROUTINE TESTING W REFLEX): HIV 1&2 Ab, 4th Generation: NONREACTIVE

## 2016-10-02 ENCOUNTER — Encounter: Payer: Self-pay | Admitting: *Deleted

## 2016-10-10 ENCOUNTER — Other Ambulatory Visit: Payer: Self-pay | Admitting: *Deleted

## 2016-10-10 NOTE — Patient Outreach (Signed)
Secure e-mail sent to [facility]  e-mail address requesting she e-mail this RNCM with dates and times that she can meet in August for Link To Wellness follow up. Barrington Ellison RN,CCM,CDE [place] Management Coordinator Link To Wellness and [employer] [phone] Office Fax [phone]

## 2016-10-16 ENCOUNTER — Encounter: Payer: Managed Care, Other (non HMO) | Admitting: Obstetrics & Gynecology

## 2016-10-31 ENCOUNTER — Encounter: Payer: Managed Care, Other (non HMO) | Admitting: Obstetrics & Gynecology

## 2016-11-06 ENCOUNTER — Ambulatory Visit: Payer: Self-pay | Admitting: *Deleted

## 2016-11-11 DIAGNOSIS — Z794 Long term (current) use of insulin: Secondary | ICD-10-CM | POA: Diagnosis not present

## 2016-11-11 DIAGNOSIS — E119 Type 2 diabetes mellitus without complications: Secondary | ICD-10-CM | POA: Diagnosis not present

## 2016-11-11 MED FILL — TRULICITY 1.5 MG/0.5 ML PEN: 1.5 | 28 days supply | Qty: 2 | Fill #0

## 2016-11-13 ENCOUNTER — Other Ambulatory Visit: Payer: Self-pay | Admitting: *Deleted

## 2016-11-14 NOTE — Patient Outreach (Addendum)
[facility]) Care Management   11/13/2016  Wanda [name] 1980-10-17 [id number]  Wanda [name] is an 36 y.o. female who presents to the [place] [facility] office for routine Link To Wellness follow up for self management assistance with Type II DM and obesity.  Subjective:  [name] said she had a double embryo implant on 11/29/15 that resulted in a miscarriage and D&C on 01/08/17. Says she had another in vitro attempt in December that did not work. She says she and her husband are going to try in vitro again in November. She says she saw her primary care provider on 11/11/16 and her most recent Hb A1C was 12.0% which was a significant increase from the previous 2.2% so Trulicity was added to her Lantus regimen. She says the Metformin was stopped due to adverse GI side effects (diarrhea) .  [name] states she is motivated to manage her diabetes better since she will be trying in vitro fertilization again in November and understands the importance of good glycemic control in getting pregnant and maintaining the pregnancy.  She says she has not been checking her blood sugar on a regular basis, exercising and following a CHO controlled meal plan because of her busy lifestyle of working 2 jobs, taking on- line courses and attending her stepchildren's sports activities.   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 She hopes to get her  associates degree in art next spring with plans to pursue a public health degree through [employer].    Objective:   Review of Systems  Constitutional: Negative.     Physical Exam  Constitutional: She is oriented to person, place, and time. She appears well-developed and well-nourished.  Respiratory: Effort normal.  Neurological: She is alert and oriented to person, place, and time.  Skin: Skin is  warm and dry.  Psychiatric: She has a normal mood and affect. Her behavior is normal. Judgment and thought content normal.    Vitals:   11/13/16 1644  BP: 104/74   Filed Weights   11/13/16 1644  Weight: 185 lb 12.8 oz (84.3 kg)    Outpatient Encounter Prescriptions as of 11/13/2016  Medication Sig  . Dulaglutide (TRULICITY) 1.5 LN/9.8XQ SOPN Inject into the skin SQ weekly  . insulin glargine (LANTUS) 100 UNIT/ML injection Inject 14 Units into the skin at bedtime. She was instructed to titrate  to a maximum of 26 units if needed to achieve a fasting CBG goal of <110 and 2 hours post meal CBG goal of <140. Marland Kitchen    No facility-administered encounter medications on file as of 11/13/2016.      Functional Status:   In your present state of health, do you have any difficulty performing the following activities: 11/13/2016 07/08/2016  Hearing? N N  Vision? N N  Difficulty concentrating or making decisions? N N  Walking or climbing stairs? N N  Dressing or bathing? N N  Doing errands, shopping? N N  Preparing Food and eating ? N N  Using the Toilet? N N  In the past six months, have you accidently leaked urine? N N  Do you have problems with loss of bowel control? N N  Managing your Medications? N N  Managing your Finances? N N  Housekeeping or managing your Housekeeping? N N  Some recent data might be hidden  Fall/Depression Screening:    PHQ 2/9 Scores 07/08/2016 07/04/2015 06/11/2015  PHQ - 2 Score 0 0 0  Exception Documentation Patient refusal - -    Assessment:   [employer] employee and Link To Wellness member with Type II DM with worsening glycemic control as evidenced by most recent Hgb A1C= 12.9% on 09/30/16,  previously 8.6% on 12/18/15. Trulicity recently added to medication regimen in addition to Lantus. Obese with current body mass index= 33.97  Plan:  [facility] CM Care Plan Problem One        Most Recent Value   Care Plan Problem One  [employer] employee with Type II DM not  meeting treatment target Hgb A1C of <7.0% as evidenced by most recent Hgb A1C= 12.9% on 12/15/22/18 , obese with body mass index= 33.97   Role Documenting the Problem One  Care Management [place] for Problem One  Active   [facility] Long Term Goal (31-90 days)  Improved glycemic control as evidenced by Hgb A1C<8.0% at next assessment and evidence of weight loss or no weight gain at next assessment, Janvi will discuss the Freestyle Libre flash glucose monitoring system with her provider   [facility] Long Term Goal Start Date  11/13/16   Interventions for Problem One Long Term Goal  Received update on health status changes and timing of future in vitro attempts, reviewed medications and medication adherence, reviewed mechanism of action of Trulicity and Lantus and reviewed titration instructions for Lantus and educated Cherylene that the  efficacy of the Lantus is primarily reflected in the fasting CBG value, reviewed the importance of good glucose management during conception and pregnancy, demonstrated the Freestyle Libre flash glucose monitoring system and benefit coverage under the [place] and provided Jayden with an informational brochure, offered to place the first [place] sensor if she opts to use the [place], reviewed CBG readings and targets, reviewed follow up appointments with her primary care provider for 01/05/17, will arrange for Link To Wellness follow up based on [name]'s needs     RNCM to fax today's office visit note to Dr. Luciana Axe. RNCM will meet quarterly and as needed with patient per Link To Wellness program guidelines to assist with Type II DM self-management and assess patient's progress toward mutually set goals.  Barrington Ellison RN,CCM,CDE [place] Management Coordinator Link To Wellness Office Phone [phone] Office Fax [phone]

## 2016-11-20 DIAGNOSIS — H6501 Acute serous otitis media, right ear: Secondary | ICD-10-CM | POA: Diagnosis not present

## 2016-11-20 DIAGNOSIS — H6121 Impacted cerumen, right ear: Secondary | ICD-10-CM | POA: Diagnosis not present

## 2016-11-20 DIAGNOSIS — H6061 Unspecified chronic otitis externa, right ear: Secondary | ICD-10-CM | POA: Diagnosis not present

## 2017-01-17 ENCOUNTER — Other Ambulatory Visit: Payer: Self-pay | Admitting: *Deleted

## 2017-01-17 NOTE — Patient Outreach (Signed)
Sent secure e-mail to patient advising Ivy that her diabetes disease self management services will be transitioned from the Link To Wellness program to [facility] or [place] in 2019. Encouraged her to contact this RNCM for questions or concerns. Barrington Ellison RN,CCM,CDE [place] Management Coordinator Link To Wellness and [employer] [phone] Office Fax [phone]

## 2017-01-27 ENCOUNTER — Other Ambulatory Visit: Payer: Self-pay | Admitting: *Deleted

## 2017-01-27 NOTE — Patient Outreach (Signed)
[name] responded to this RNCM's earlier e-mail of today and says she understands the changes to the Link To Wellness program and expressed appreciation to this RNCM for the help provided to her in managing her diabetes. 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 Barrington Ellison RN,CCM,CDE [place] Management Coordinator Link To Wellness and [employer] [phone] Office Fax [phone]

## 2017-01-27 NOTE — Patient Outreach (Signed)
Second secure e-mail sent to [facility] e-mail address, the first one was sent on 01/15/17 but she dd not respond,  advising  that disease self-management services will be transitioned from the Link To Wellness program to either [facility] or [facility] in 2019.                          Will close case to Link To Wellness diabetes program. Barrington Ellison RN,CCM,CDE [place] Management Coordinator Link To Wellness and [employer] [phone] Office Fax [phone]

## 2017-04-07 ENCOUNTER — Other Ambulatory Visit: Payer: Self-pay | Admitting: Family Medicine

## 2017-04-07 DIAGNOSIS — R1011 Right upper quadrant pain: Secondary | ICD-10-CM

## 2017-04-15 ENCOUNTER — Telehealth: Payer: Self-pay

## 2017-04-15 NOTE — Telephone Encounter (Signed)
[facility] called to cancel referral for this patient

## 2017-04-24 ENCOUNTER — Ambulatory Visit
Admission: RE | Admit: 2017-04-24 | Discharge: 2017-04-24 | Disposition: A | Discharge status: Home / Self Care | Payer: No Typology Code available for payment source | Source: Ambulatory Visit | Attending: Family Medicine | Admitting: Family Medicine

## 2017-04-24 DIAGNOSIS — R1011 Right upper quadrant pain: Secondary | ICD-10-CM

## 2017-07-11 IMAGING — US US SOFT TISSUE HEAD/NECK
1 series · 14 of 25 positions shown · non-contrast
Comparison: None.

CLINICAL DATA: Thyromegaly

EXAM:
THYROID ULTRASOUND
TECHNIQUE: Ultrasound examination of the thyroid gland and adjacent soft
tissues was performed.

[Series 1: us soft tissue head/neck · 0.08mm/px · 14 of 34 slices shown]
[im 1/34]
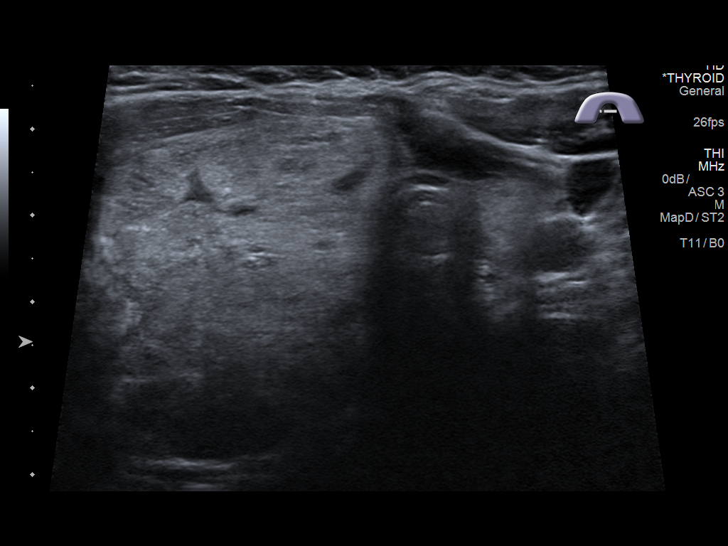
[im 3/34]
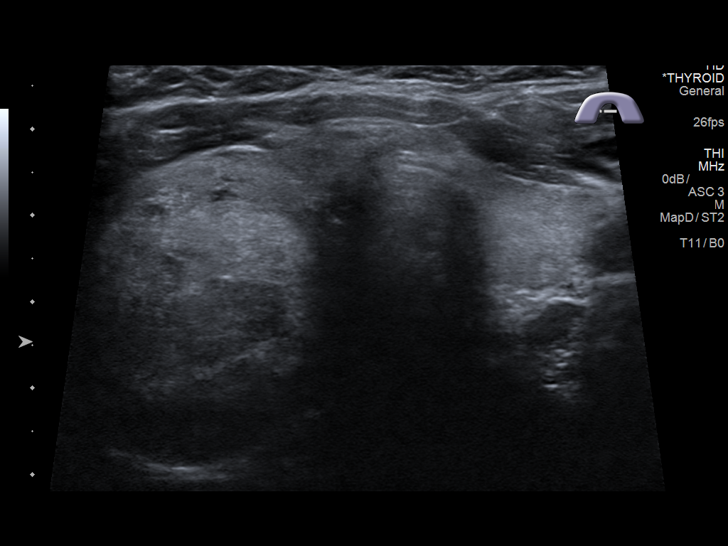
[im 6/34]
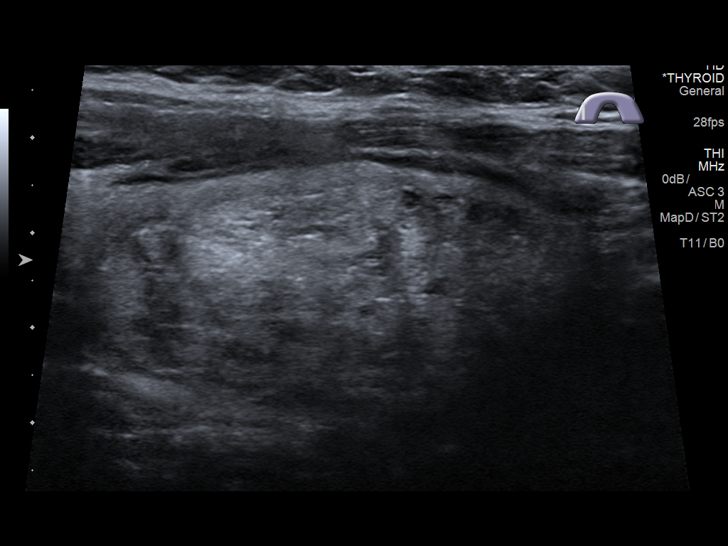
[im 9/34]
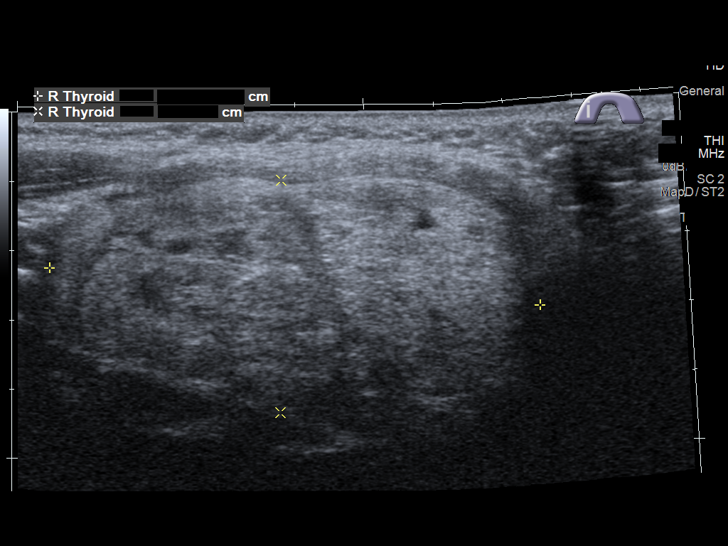
[im 12/34]
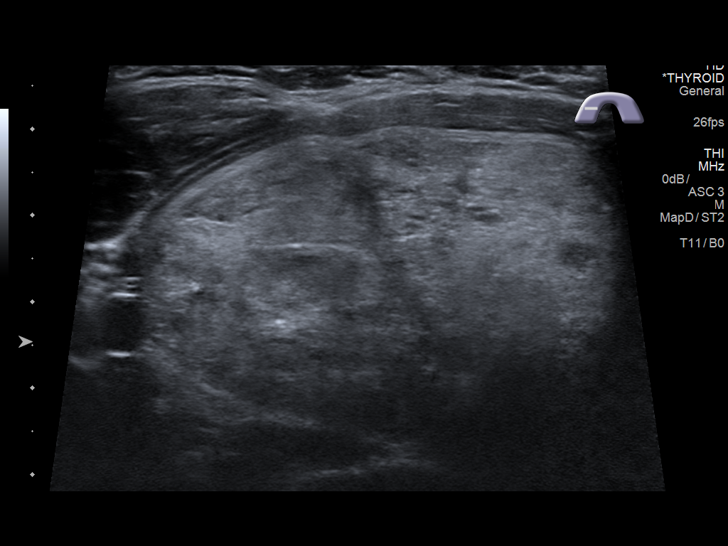
[im 13/34]
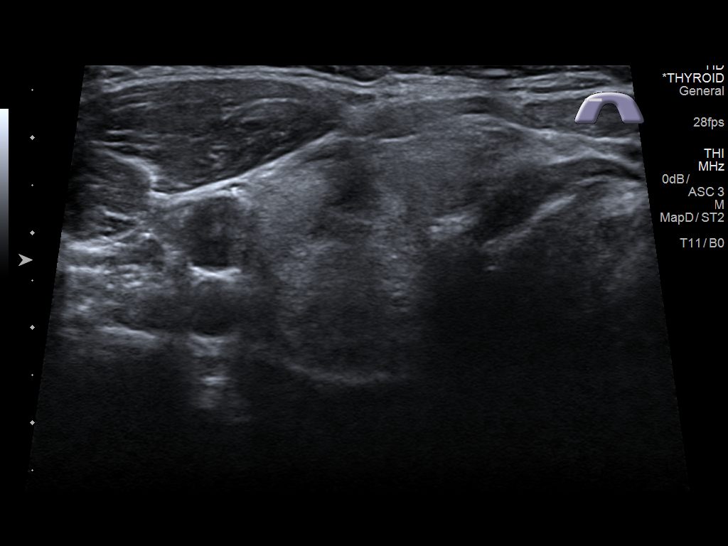
[im 16/34]
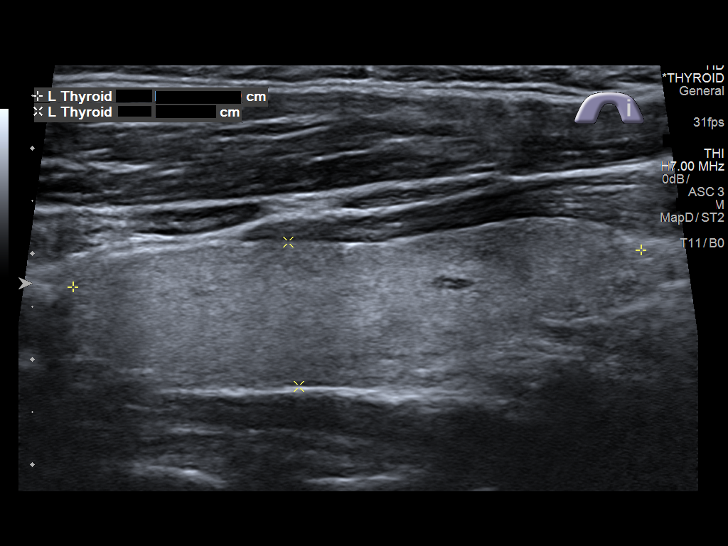
[im 18/34]
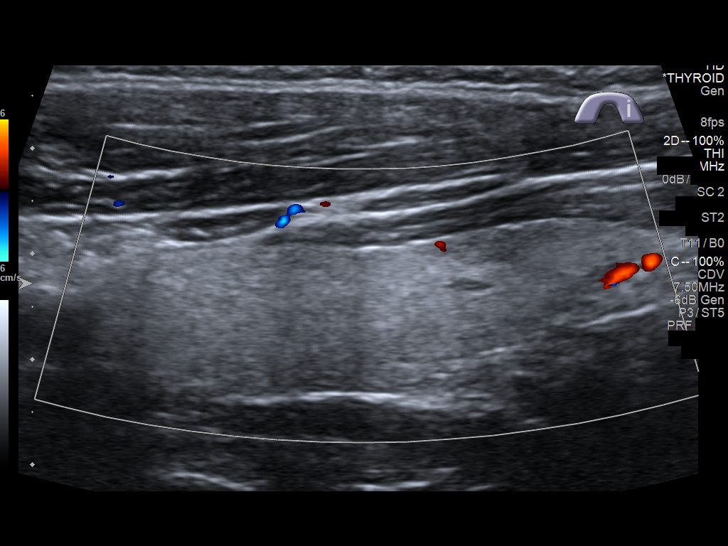
[im 21/34]
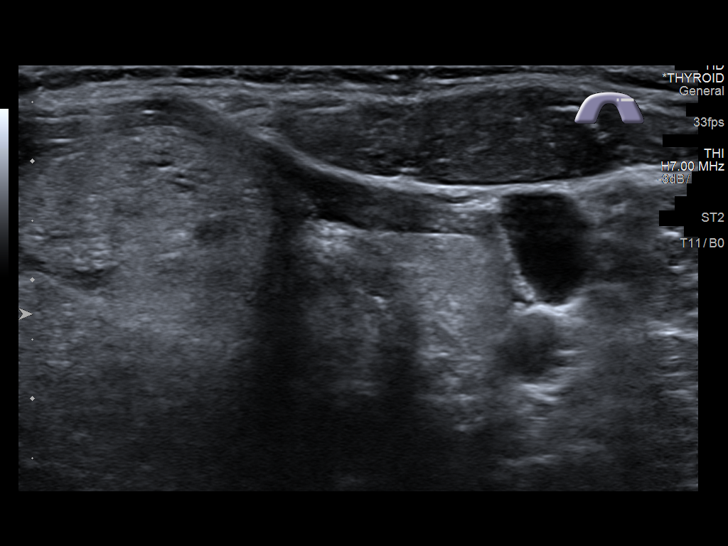
[im 23/34]
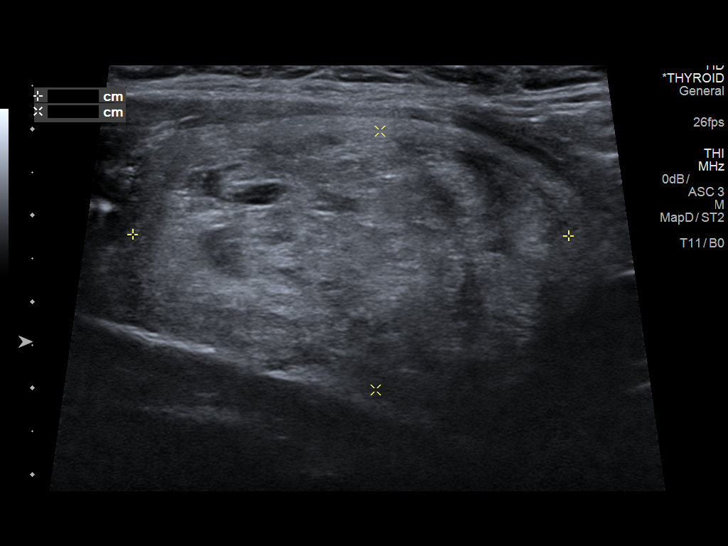
[im 25/34]
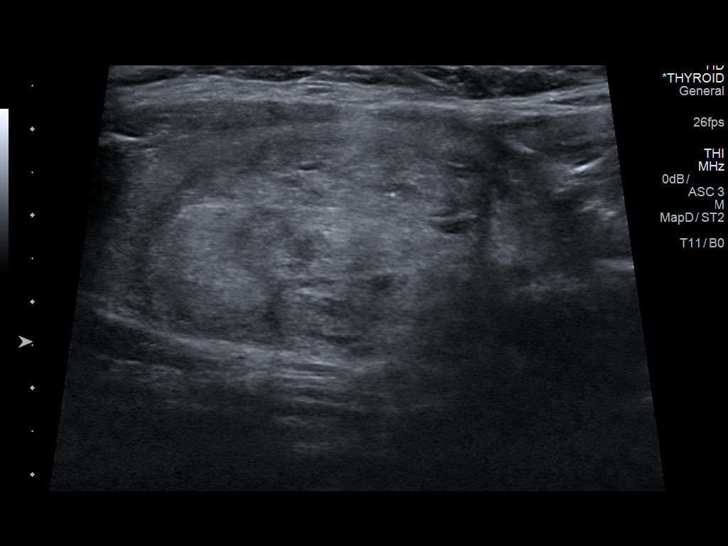
[im 28/34]
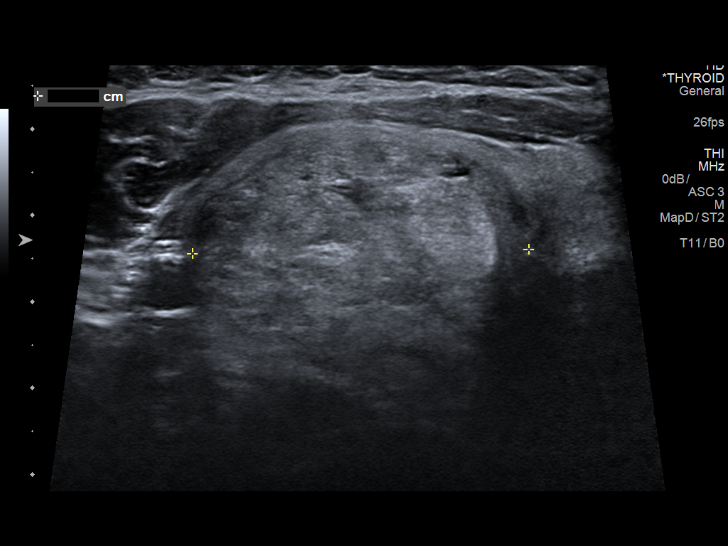
[im 31/34]
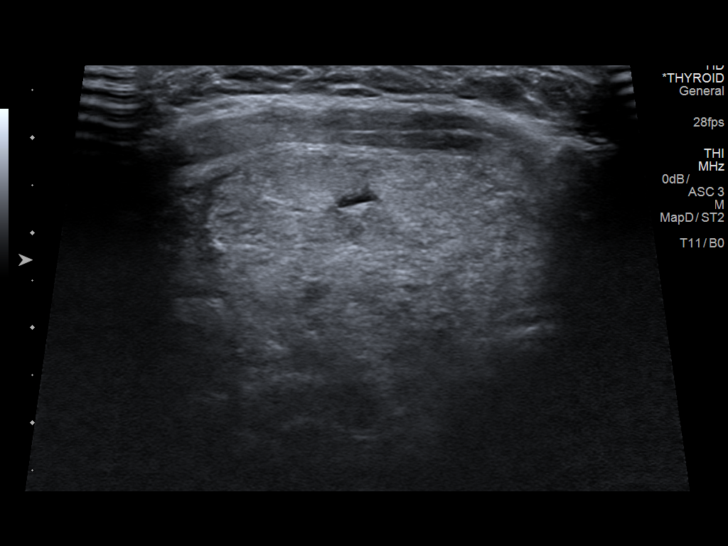
[im 34/34]
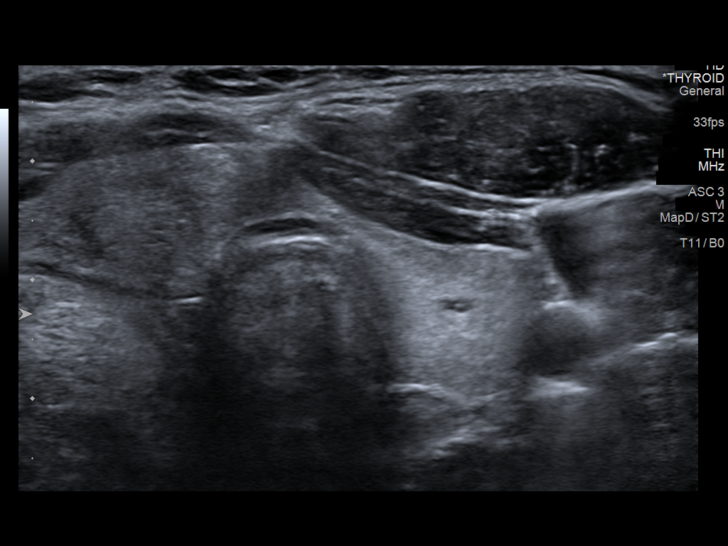

[14 of 25 positions shown; findings below may reference images not displayed]

FINDINGS: Right thyroid lobe

Measurements: Enlarged measuring 7.1 x 3.4 x 4.1 cm.

Right, superior - 5.1 x 3.9 x 3.1 cm - mixed echogenic, solid.

Right, inferior - 4.0 x 3.4 x 2.6 cm - mixed echogenic, solid

Left thyroid lobe

Measurements: Normal in size measuring 5.4 x 1.4 x 1.4 cm.

No discrete nodule or mass identified within the left lobe of the
thyroid.

Isthmus

Thickness: Normal in size measuring 0.5 cm in diameter.

No discrete nodule or mass is identified within the thyroid isthmus.

Lymphadenopathy

None visualized.
IMPRESSION: The approximately 5.1 and 4.0 cm nodules / masses within the right
lobe of the thyroid both meet imaging criteria to recommend
percutaneous sampling. This recommendation follows the consensus
statement: Management of Thyroid Nodules Detected at US: Society of
Radiologists in Ultrasound Consensus Conference Statement. Radiology

## 2017-07-23 IMAGING — US US BIOPSY
1 series · 13 of 14 positions shown · non-contrast
Comparison: US Soft Tissue Head/Neck 06/22/2015

MEDICATIONS:
10 cc 1% lidocaine

COMPLICATIONS:
None immediate.

INDICATION: Indeterminate thyroid nodules

Inferior and superior Right thyroid nodules
Inferior nodule 4.0 cm
Superior nodule 5.1 cm
EXAM:
ULTRASOUND GUIDED THYROID FINE NEEDLE ASPIRATION x2
TECHNIQUE: Informed written consent was obtained from the patient after a
discussion of the risks, benefits and alternatives to treatment.
Questions regarding the procedure were encouraged and answered. A
timeout was performed prior to the initiation of the procedure.

[Series 1: us biopsy · 0.09mm/px · 14 acquisitions, 13 frames shown]
[im 1/14]
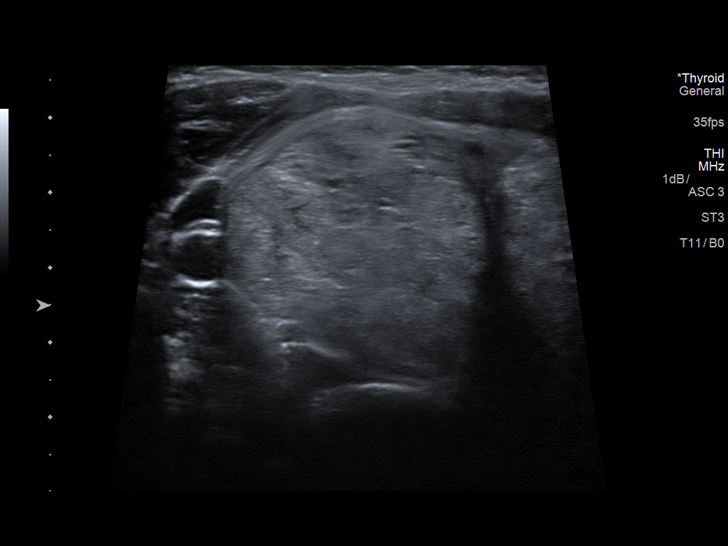
[im 2/14]
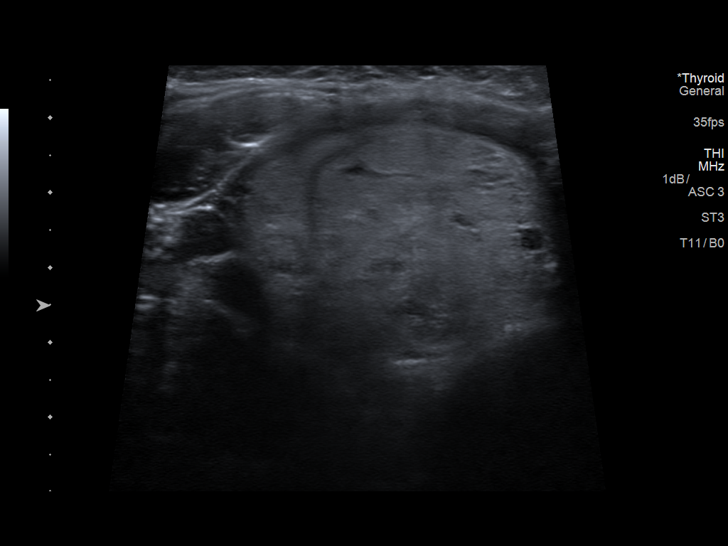
[im 3/14]
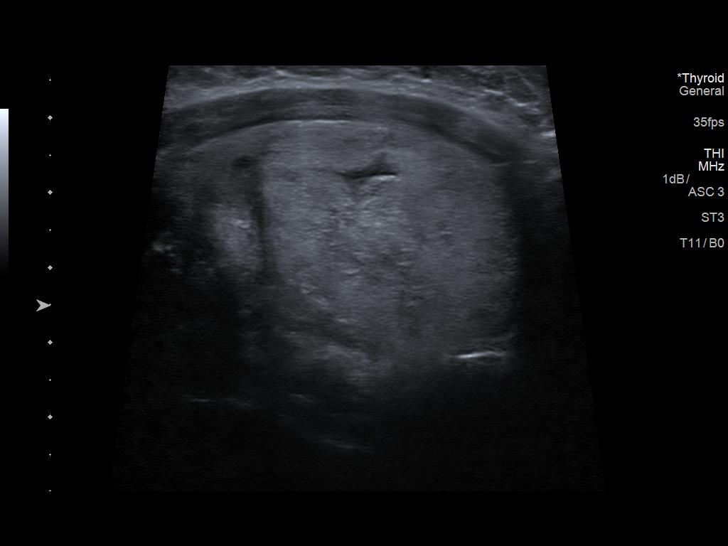
[im 4/14]
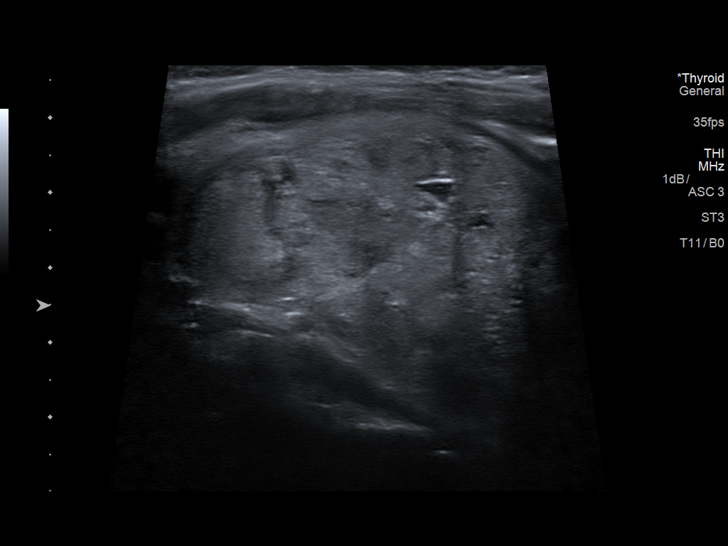
[im 5/14]
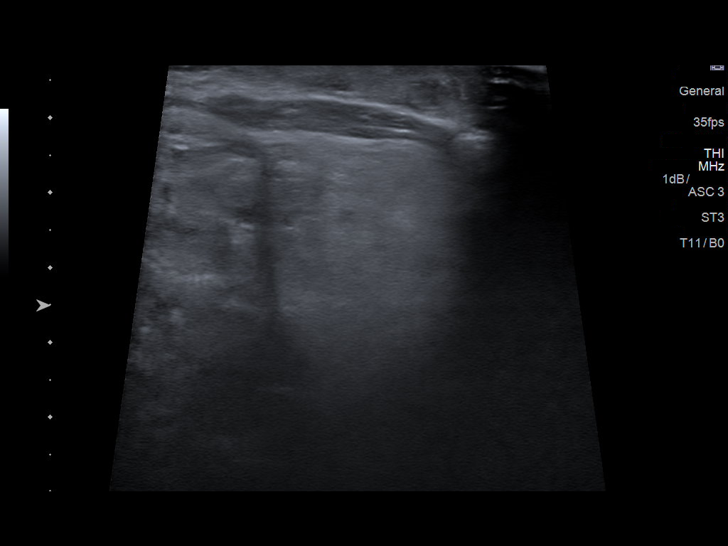
[im 6/14]
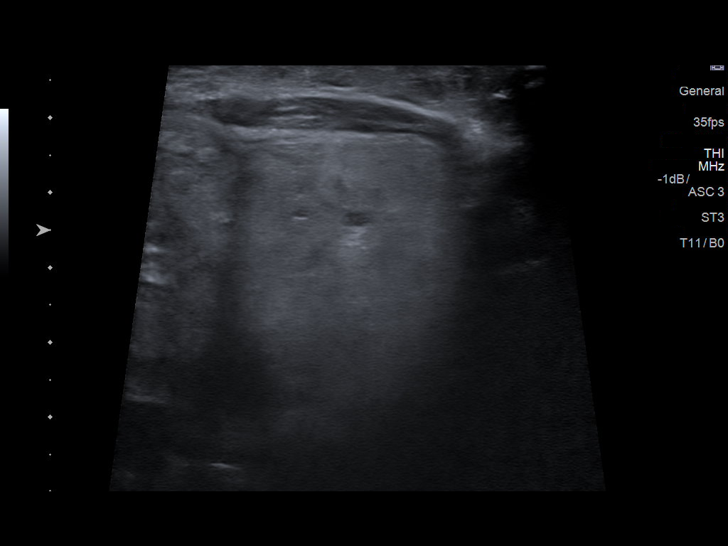
[im 8/14]
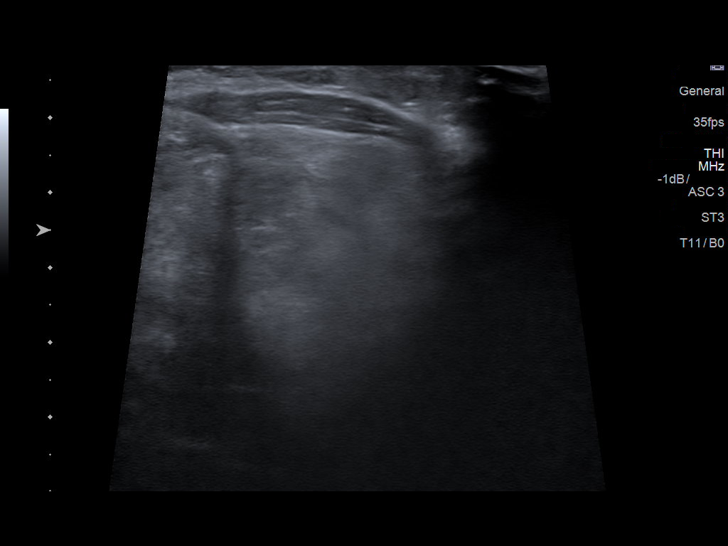
[im 9/14]
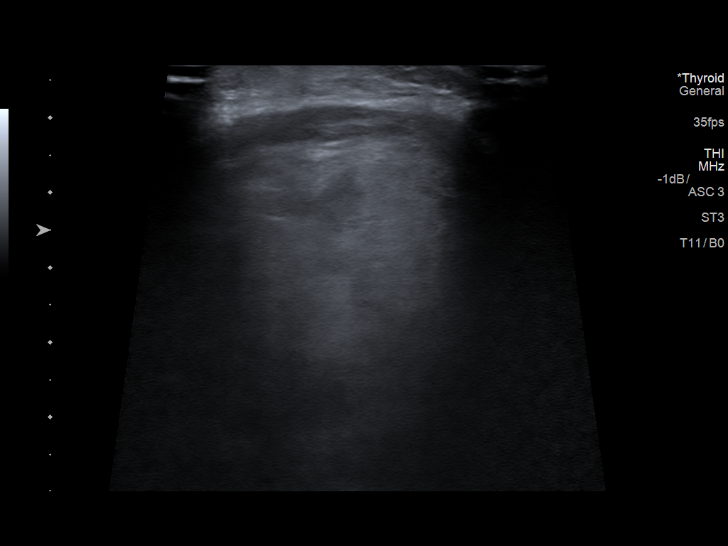
[im 10/14]
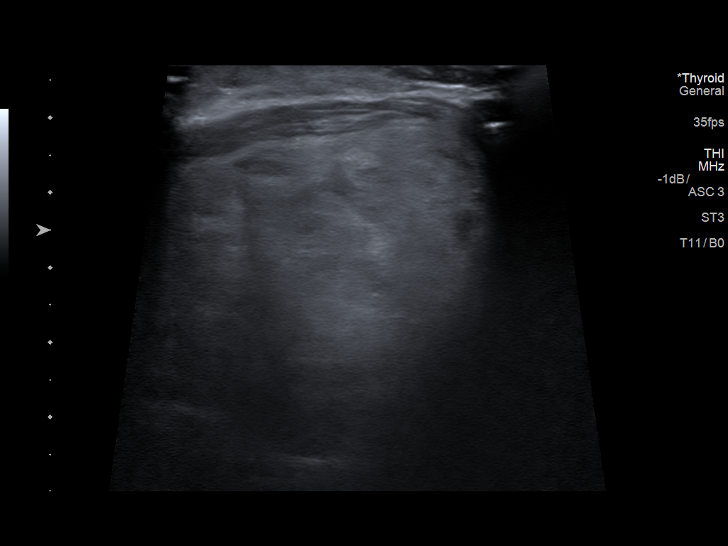
[im 11/14]
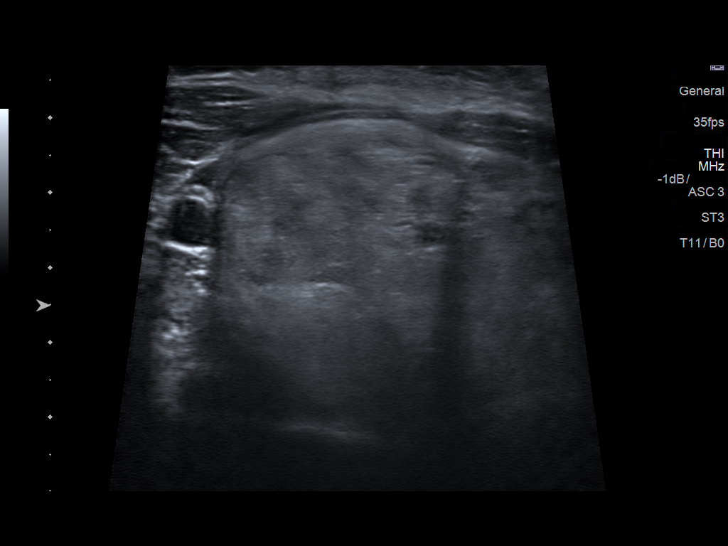
[im 12/14]
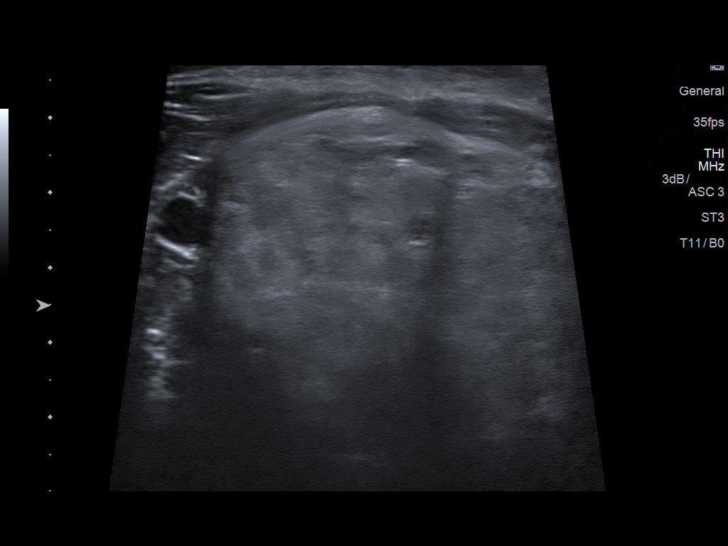
[im 13/14]
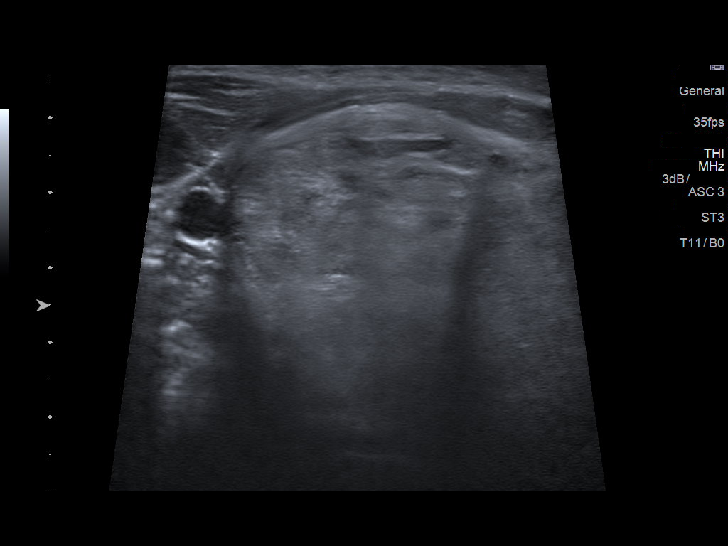
[im 14/14]
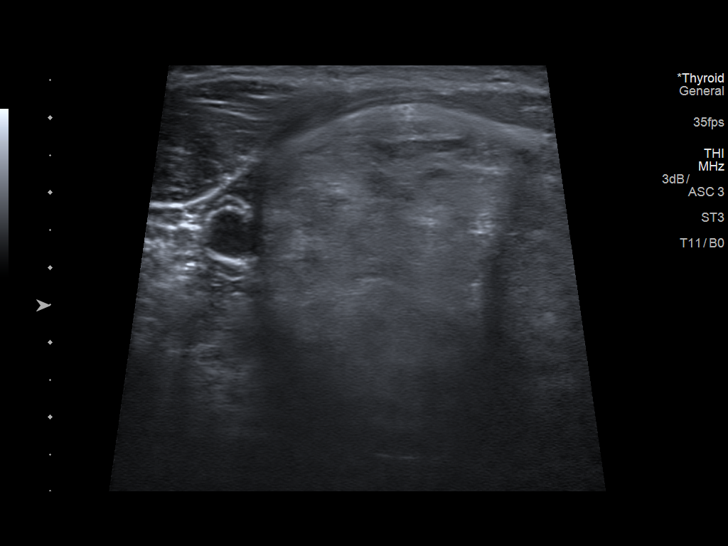

[13 of 14 positions shown; findings below may reference images not displayed]

Pre-procedural ultrasound scanning demonstrated Right thyroid
nodules

The procedures were planned. The neck was prepped in the usual
sterile fashion, and a sterile drape was applied covering the
operative field. A timeout was performed prior to the initiation of
the procedure. Local anesthesia was provided with 1% lidocaine.

Under direct ultrasound guidance, 3 FNA biopsies were performed of
the Right inferior nodule with a 25 gauge needle. The samples were
prepared and submitted to pathology.

Under direct ultrasound guidance, 3 FNA biopsies were performed of
the Right superior nodule with a 25 gauge needle. The samples were
prepared and submitted to pathology.

Limited post procedural scanning was negative for hematoma or
additional complication. Dressings were placed. The patient
tolerated the above procedures procedure well without immediate
postprocedural complication.
IMPRESSION: Technically successful ultrasound guided fine needle aspiration of
right thyroid inferior and superior nodules

Read by:  Dingeman Muurling

## 2017-12-08 HISTORY — PX: DILATION AND CURETTAGE OF UTERUS: SHX78

## 2018-01-22 MED FILL — ESTRADIOL 2 MG TABLET: 2 | 30 days supply | Qty: 120 | Fill #0

## 2018-01-22 MED FILL — MEDROXYPROGESTERONE 10 MG T: 10 | 5 days supply | Qty: 5 | Fill #0

## 2018-04-06 MED FILL — HUMALOG 100 UNITS/ML KWIKPE: 100 | 33 days supply | Qty: 15 | Fill #0

## 2018-04-06 MED FILL — LANTUS SOLOSTAR 100 UNITS/M: 100 | 57 days supply | Qty: 15 | Fill #0

## 2018-04-07 MED FILL — ALYACEN 1-35-28 TABLET: 1-35 | 56 days supply | Qty: 56 | Fill #0

## 2019-02-03 ENCOUNTER — Telehealth: Payer: No Typology Code available for payment source | Admitting: Physician Assistant

## 2019-02-03 DIAGNOSIS — L739 Follicular disorder, unspecified: Secondary | ICD-10-CM | POA: Diagnosis not present

## 2019-02-03 MED ORDER — CEPHALEXIN 500 MG PO CAPS: 500.0000 mg | ORAL_CAPSULE | Freq: Three times a day (TID) | ORAL | 0 refills | Status: DC

## 2019-02-03 NOTE — Progress Notes (Signed)
  E Visit for Folliculitis   We are sorry you are not feeling well.  Here is how we plan to help!  Based on what you have shared with me it looks like you have folliculitis.  Folliculitis refers to inflammation of the superficial or deep portio of the hair follicle.  It can be infectious or non-infectious. Various bacteria, fungi, viruses, and parasites can cause infectious folliculis  Based upon what you have shared with me it looks like you have a bacterial follicultits.  Folliculitis is inflammation of the hair follicles that can be caused by a superficial infection of the skin and is treated with an antibiotic. I have prescribed:  Keflex 500 mg one tablet three times a day for 7 days   HOME CARE:  Apply a warm, moist washcloth or compress using a saltwater solution (1 teaspoon of table salt to 2 cups water)  Apply over the counter antibiotic cream, gel or wash   Apply soothing lotions such as oatmeal lotion or over the counter hydrocortisone cream  Clean the affected skin twice daily with antibacterial soap. Use clean washcloth and towel each time and do not share with anyone.  Wash these items and clothes that have touched the area with hot soapy water.  Protect the skin. If possible avoid shaving.  If you must shave, try an Copy.  When done, rinse skin with warm water and apply moisturizer.  GET HELP RIGHT AWAY IF:  You have extensive skin involvement or the symptoms return after treatment  Symptoms don't go away after treatment.  Severe itching that persists.  If you rash spreads or swells.  If you rash begins to smell.  If it blisters and opens or develops a yellow-brown crust.  You develop a fever.  You have a sore throat.  You become short of breath.  MAKE SURE YOU:  Understand these instructions. Will watch your condition. Will get help right away if you are not doing well or get worse.  Thank you for choosing an e-visit. Your e-visit answers  were reviewed by a board certified advanced clinical practitioner to complete your personal care plan. Depending upon the condition, your plan could have included both over the counter or prescription medications. Please review your pharmacy choice. Be sure that the pharmacy you have chosen is open so that you can pick up your prescription now.  If there is a problem you may message your provider in [place] to have the prescription routed to another pharmacy. Your safety is important to [place]. If you have drug allergies check your prescription carefully.  For the next 24 hours, you can use MyChart to ask questions about today's visit, request a non-urgent call back, or ask for a work or school excuse from your e-visit provider. You will get an email in the next two days asking about your experience. I hope that your e-visit has been valuable and will speed your recovery.   Particia Nearing PA-C  Approximately 5 minutes was spent documenting and reviewing patient's chart.

## 2019-02-04 MED FILL — HUMALOG 100 UNITS/ML KWIKPE: 100 | 33 days supply | Qty: 15 | Fill #0

## 2019-02-04 MED FILL — LANTUS SOLOSTAR 100 UNITS/M: 100 | 57 days supply | Qty: 15 | Fill #0

## 2019-02-04 MED FILL — CEPHALEXIN 500 MG CAPSULE: 500 | 7 days supply | Qty: 21 | Fill #0

## 2019-02-17 ENCOUNTER — Other Ambulatory Visit: Payer: Self-pay

## 2019-02-21 ENCOUNTER — Encounter: Payer: Self-pay | Admitting: Women's Health

## 2019-02-21 ENCOUNTER — Other Ambulatory Visit: Payer: Self-pay

## 2019-02-21 ENCOUNTER — Ambulatory Visit (INDEPENDENT_AMBULATORY_CARE_PROVIDER_SITE_OTHER): Payer: No Typology Code available for payment source | Admitting: Women's Health

## 2019-02-21 VITALS — BP 124/80 | Ht 62.0 in | Wt 176.0 lb

## 2019-02-21 DIAGNOSIS — Z01419 Encounter for gynecological examination (general) (routine) without abnormal findings: Secondary | ICD-10-CM | POA: Diagnosis not present

## 2019-02-21 DIAGNOSIS — B9689 Other specified bacterial agents as the cause of diseases classified elsewhere: Secondary | ICD-10-CM

## 2019-02-21 DIAGNOSIS — E139 Other specified diabetes mellitus without complications: Secondary | ICD-10-CM | POA: Diagnosis not present

## 2019-02-21 DIAGNOSIS — B373 Candidiasis of vulva and vagina: Secondary | ICD-10-CM

## 2019-02-21 DIAGNOSIS — N760 Acute vaginitis: Secondary | ICD-10-CM

## 2019-02-21 DIAGNOSIS — B3731 Acute candidiasis of vulva and vagina: Secondary | ICD-10-CM

## 2019-02-21 LAB — WET PREP FOR TRICH, YEAST, CLUE

## 2019-02-21 MED ORDER — TERCONAZOLE 0.4 % VA CREA: TOPICAL_CREAM | VAGINAL | 0 refills | Status: DC

## 2019-02-21 MED ORDER — METRONIDAZOLE 0.75 % VA GEL: VAGINAL | 0 refills | Status: DC

## 2019-02-21 MED FILL — UNIFINE PENTIPS 8MM 31G: 31G X 8 MM | 90 days supply | Qty: 400 | Fill #0

## 2019-02-21 MED FILL — FREESTYLE LANCETS: 90 days supply | Qty: 300 | Fill #0

## 2019-02-21 MED FILL — TERCONAZOLE 0.4% CREAM: 0.4 | 7 days supply | Qty: 45 | Fill #0

## 2019-02-21 MED FILL — metroNIDAZOLE 0.75 % GEL: 0.75 | 5 days supply | Qty: 70 | Fill #0

## 2019-02-21 MED FILL — SM ALCOHOL 70% PREP PADS: 70 | 30 days supply | Qty: 100 | Fill #0

## 2019-02-21 MED FILL — FREESTYLE LITE TEST STRIP: 90 days supply | Qty: 300 | Fill #0

## 2019-02-21 NOTE — Progress Notes (Signed)
Wanda [name] 03/09/1981 [id number]    History:    Presents for annual exam.  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  Is not planning to proceed with any further treatment.  Normal Pap history, has mammogram scheduled this week.  Primary care managing type 2 diabetes on insulin for 5 years with poor control last hemoglobin A1c 11.  Hypertension.  Complaints of vaginal irritation with intense itching externally.  Primary care managing hypertension and diabetes.  Goiter with normal TSH results and negative biopsy.  Past medical history, past surgical history, family history and social history were all reviewed and documented in the EPIC chart.  Medical records for [facility],  oncology department in [place].  No family history of diabetes.  Husband healthy has 3 children youngest is 75.  ROS:  A ROS was performed and pertinent positives and negatives are included.  Exam:  Vitals:   02/21/19 1512  BP: 124/80  Weight: 176 lb (79.8 kg)  Height: 5\' 2"  (1.575 m)   Body mass index is 32.19 kg/m.   General appearance:  Normal Thyroid: Goiter with palpable masses or nodularity. Respiratory  Auscultation:  Clear without wheezing or rhonchi Cardiovascular  Auscultation:  Regular rate, without rubs, murmurs or gallops  Edema/varicosities:  Not grossly evident Abdominal  Soft,nontender, without masses, guarding or rebound.  Liver/spleen:  No organomegaly noted  Hernia:  None appreciated  Skin  Inspection:  Grossly normal   Breasts: Examined lying and sitting.     Right: Without masses, retractions, discharge or axillary adenopathy.     Left: Without masses, retractions, discharge or axillary adenopathy. Gentitourinary   Inguinal/mons:  Normal without inguinal adenopathy  External genitalia: Extremely erythematous on the perineum and at introitus, wet prep positive for clues, TNTC bacteria   BUS/Urethra/Skene's glands:   Normal  Vagina: Mild erythema to vaginal walls wet prep positive for BV   Cervix:  Normal  Uterus:   normal in size, shape and contour.  Midline and mobile  Adnexa/parametria:     Rt: Without masses or tenderness.   Lt: Without masses or tenderness.  Anus and perineum: Normal  Digital rectal exam: Normal sphincter tone without palpated masses or tenderness  Assessment/Plan:  38 y.o. MBF G1, P0 for annual exam with complaint of vaginal irritation with intense itching  Monthly cycles/infertility Bacterial vaginosis and clinical yeast Hypertension, poor control of type 2 diabetes on insulin-primary care manages labs and meds Goiter history of negative biopsy and normal TSH  Plan: MetroGel vaginal cream 1 applicator at bedtime x5, alcohol precautions reviewed, Terazol vaginal cream twice daily externally.  Instructed to call if no relief of external irritation.  Reviewed importance of decreasing hemoglobin A1c and better control of diabetes to help with preventing vaginal irritation.  Will recheck hemoglobin A1c and schedule referral to endocrinologist Dr. Cruzita [name].  SBEs, has first annual screening mammogram scheduled for this week.  Reviewed importance of increasing daily walking, exercise, calcium rich foods, MVI daily.  Has spoken to a nutritionist in the past.  Encouraged selenium, reviewed may help decrease quarter size/thyroid function.  Pap normal 2018, new screening guidelines reviewed.      [name] [facility], 5:07 PM 02/21/2019

## 2019-02-21 NOTE — Patient Instructions (Addendum)
Selenium  2-3 [place] nuts daily It was good to meet you today!!  Health Maintenance, Female Adopting a healthy lifestyle and getting preventive care are important in promoting health and wellness. Ask your health care provider about:  The right schedule for you to have regular tests and exams.  Things you can do on your own to prevent diseases and keep yourself healthy. What should I know about diet, weight, and exercise? Eat a healthy diet   Eat a diet that includes plenty of vegetables, fruits, low-fat dairy products, and lean protein.  Do not eat a lot of foods that are high in solid fats, added sugars, or sodium. Maintain a healthy weight Body mass index (BMI) is used to identify weight problems. It estimates body fat based on height and weight. Your health care provider can help determine your BMI and help you achieve or maintain a healthy weight. Get regular exercise Get regular exercise. This is one of the most important things you can do for your health. Most adults should:  Exercise for at least 150 minutes each week. The exercise should increase your heart rate and make you sweat (moderate-intensity exercise).  Do strengthening exercises at least twice a week. This is in addition to the moderate-intensity exercise.  Spend less time sitting. Even light physical activity can be beneficial. Watch cholesterol and blood lipids Have your blood tested for lipids and cholesterol at 40 years of age, then have this test every 5 years. Have your cholesterol levels checked more often if:  Your lipid or cholesterol levels are high.  You are older than 40 years of age.  You are at high risk for heart disease. What should I know about cancer screening? Depending on your health history and family history, you may need to have cancer screening at various ages. This may include screening for:  Breast cancer.  Cervical cancer.  Colorectal cancer.  Skin cancer.  Lung cancer. What  should I know about heart disease, diabetes, and high blood pressure? Blood pressure and heart disease  High blood pressure causes heart disease and increases the risk of stroke. This is more likely to develop in people who have high blood pressure readings, are of African descent, or are overweight.  Have your blood pressure checked: ? Every 3-5 years if you are 35-64 years of age. ? Every year if you are 91 years old or older. Diabetes Have regular diabetes screenings. This checks your fasting blood sugar level. Have the screening done:  Once every three years after age 20 if you are at a normal weight and have a low risk for diabetes.  More often and at a younger age if you are overweight or have a high risk for diabetes. What should I know about preventing infection? Hepatitis B If you have a higher risk for hepatitis B, you should be screened for this virus. Talk with your health care provider to find out if you are at risk for hepatitis B infection. Hepatitis C Testing is recommended for:  Everyone born from [birth year] through 1967.  Anyone with known risk factors for hepatitis C. Sexually transmitted infections (STIs)  Get screened for STIs, including gonorrhea and chlamydia, if: ? You are sexually active and are younger than 40 years of age. ? You are older than 40 years of age and your health care provider tells you that you are at risk for this type of infection. ? Your sexual activity has changed since you were last screened, and you  are at increased risk for chlamydia or gonorrhea. Ask your health care provider if you are at risk.  Ask your health care provider about whether you are at high risk for HIV. Your health care provider may recommend a prescription medicine to help prevent HIV infection. If you choose to take medicine to prevent HIV, you should first get tested for HIV. You should then be tested every 3 months for as long as you are taking the medicine. Pregnancy  If  you are about to stop having your period (premenopausal) and you may become pregnant, seek counseling before you get pregnant.  Take 400 to 800 micrograms (mcg) of folic acid every day if you become pregnant.  Ask for birth control (contraception) if you want to prevent pregnancy. Osteoporosis and menopause Osteoporosis is a disease in which the bones lose minerals and strength with aging. This can result in bone fractures. If you are 107 years old or older, or if you are at risk for osteoporosis and fractures, ask your health care provider if you should:  Be screened for bone loss.  Take a calcium or vitamin D supplement to lower your risk of fractures.  Be given hormone replacement therapy (HRT) to treat symptoms of menopause. Follow these instructions at home: Lifestyle  Do not use any products that contain nicotine or tobacco, such as cigarettes, e-cigarettes, and chewing tobacco. If you need help quitting, ask your health care provider.  Do not use street drugs.  Do not share needles.  Ask your health care provider for help if you need support or information about quitting drugs. Alcohol use  Do not drink alcohol if: ? Your health care provider tells you not to drink. ? You are pregnant, may be pregnant, or are planning to become pregnant.  If you drink alcohol: ? Limit how much you use to 0-1 drink a day. ? Limit intake if you are breastfeeding.  Be aware of how much alcohol is in your drink. In the [place] one drink equals one 12 oz bottle of beer (355 mL), one 5 oz glass of wine (148 mL), or one 1 oz glass of hard liquor (44 mL). General instructions  Schedule regular health, dental, and eye exams.  Stay current with your vaccines.  Tell your health care provider if: ? You often feel depressed. ? You have ever been abused or do not feel safe at home. Summary  Adopting a healthy lifestyle and getting preventive care are important in promoting health and  wellness.  Follow your health care provider's instructions about healthy diet, exercising, and getting tested or screened for diseases.  Follow your health care provider's instructions on monitoring your cholesterol and blood pressure. This information is not intended to replace advice given to you by your health care provider. Make sure you discuss any questions you have with your health care provider. Document Released: 09/09/2010 Document Revised: 02/17/2018 Document Reviewed: 02/17/2018 Elsevier Patient Education  2020 [place].  Vaginal Yeast infection, Adult  Vaginal yeast infection is a condition that causes vaginal discharge as well as soreness, swelling, and redness (inflammation) of the vagina. This is a common condition. Some women get this infection frequently. What are the causes? This condition is caused by a change in the normal balance of the yeast (candida) and bacteria that live in the vagina. This change causes an overgrowth of yeast, which causes the inflammation. What increases the risk? The condition is more likely to develop in women who:  Take antibiotic  medicines.  Have diabetes.  Take birth control pills.  Are pregnant.  Douche often.  Have a weak body defense system (immune system).  Have been taking steroid medicines for a long time.  Frequently wear tight clothing. What are the signs or symptoms? Symptoms of this condition include:  White, thick, creamy vaginal discharge.  Swelling, itching, redness, and irritation of the vagina. The lips of the vagina (vulva) may be affected as well.  Pain or a burning feeling while urinating.  Pain during sex. How is this diagnosed? This condition is diagnosed based on:  Your medical history.  A physical exam.  A pelvic exam. Your health care provider will examine a sample of your vaginal discharge under a microscope. Your health care provider may send this sample for testing to confirm the  diagnosis. How is this treated? This condition is treated with medicine. Medicines may be over-the-counter or prescription. You may be told to use one or more of the following:  Medicine that is taken by mouth (orally).  Medicine that is applied as a cream (topically).  Medicine that is inserted directly into the vagina (suppository). Follow these instructions at home:  Lifestyle  Do not have sex until your health care provider approves. Tell your sex partner that you have a yeast infection. That person should go to his or her health care provider and ask if they should also be treated.  Do not wear tight clothes, such as pantyhose or tight pants.  Wear breathable cotton underwear. General instructions  Take or apply over-the-counter and prescription medicines only as told by your health care provider.  Eat more yogurt. This may help to keep your yeast infection from returning.  Do not use tampons until your health care provider approves.  Try taking a sitz bath to help with discomfort. This is a warm water bath that is taken while you are sitting down. The water should only come up to your hips and should cover your buttocks. Do this 3-4 times per day or as told by your health care provider.  Do not douche.  If you have diabetes, keep your blood sugar levels under control.  Keep all follow-up visits as told by your health care provider. This is important. Contact a health care provider if:  You have a fever.  Your symptoms go away and then return.  Your symptoms do not get better with treatment.  Your symptoms get worse.  You have new symptoms.  You develop blisters in or around your vagina.  You have blood coming from your vagina and it is not your menstrual period.  You develop pain in your abdomen. Summary  Vaginal yeast infection is a condition that causes discharge as well as soreness, swelling, and redness (inflammation) of the vagina.  This condition is  treated with medicine. Medicines may be over-the-counter or prescription.  Take or apply over-the-counter and prescription medicines only as told by your health care provider.  Do not douche. Do not have sex or use tampons until your health care provider approves.  Contact a health care provider if your symptoms do not get better with treatment or your symptoms go away and then return. This information is not intended to replace advice given to you by your health care provider. Make sure you discuss any questions you have with your health care provider. Document Released: 12/04/2004 Document Revised: 07/13/2017 Document Reviewed: 07/13/2017 Elsevier Patient Education  [place] 2 brazel nuts daily  Bacterial  Vaginosis  Bacterial vaginosis is an infection of the vagina. It happens when too many normal germs (healthy bacteria) grow in the vagina. This infection puts you at risk for infections from sex (STIs). Treating this infection can lower your risk for some STIs. You should also treat this if you are pregnant. It can cause your baby to be born early. Follow these instructions at home: Medicines  Take over-the-counter and prescription medicines only as told by your doctor.  Take or use your antibiotic medicine as told by your doctor. Do not stop taking or using it even if you start to feel better. General instructions  If you your sexual partner is a woman, tell her that you have this infection. She needs to get treatment if she has symptoms. If you have a female partner, he does not need to be treated.  During treatment: ? Avoid sex. ? Do not douche. ? Avoid alcohol as told. ? Avoid breastfeeding as told.  Drink enough fluid to keep your pee (urine) clear or pale yellow.  Keep your vagina and butt (rectum) clean. ? Wash the area with warm water every day. ? Wipe from front to back after you use the toilet.  Keep all follow-up visits as told by your doctor. This is  important. Preventing this condition  Do not douche.  Use only warm water to wash around your vagina.  Use protection when you have sex. This includes: ? Latex condoms. ? Dental dams.  Limit how many people you have sex with. It is best to only have sex with the same person (be monogamous).  Get tested for STIs. Have your partner get tested.  Wear underwear that is cotton or lined with cotton.  Avoid tight pants and pantyhose. This is most important in summer.  Do not use any products that have nicotine or tobacco in them. These include cigarettes and e-cigarettes. If you need help quitting, ask your doctor.  Do not use illegal drugs.  Limit how much alcohol you drink. Contact a doctor if:  Your symptoms do not get better, even after you are treated.  You have more discharge or pain when you pee (urinate).  You have a fever.  You have pain in your belly (abdomen).  You have pain with sex.  Your bleed from your vagina between periods. Summary  This infection happens when too many germs (bacteria) grow in the vagina.  Treating this condition can lower your risk for some infections from sex (STIs).  You should also treat this if you are pregnant. It can cause early (premature) birth.  Do not stop taking or using your antibiotic medicine even if you start to feel better. This information is not intended to replace advice given to you by your health care provider. Make sure you discuss any questions you have with your health care provider. Document Released: 12/04/2007 Document Revised: 02/06/2017 Document Reviewed: 11/10/2015 Elsevier Patient Education  2020 [employer].

## 2019-02-22 ENCOUNTER — Telehealth: Payer: Self-pay | Admitting: *Deleted

## 2019-02-22 DIAGNOSIS — E139 Other specified diabetes mellitus without complications: Secondary | ICD-10-CM

## 2019-02-22 DIAGNOSIS — R7309 Other abnormal glucose: Secondary | ICD-10-CM

## 2019-02-22 LAB — HEMOGLOBIN A1C
Hgb A1c MFr Bld: 12.3 % of total Hgb — ABNORMAL HIGH (ref <5.7)
Mean Plasma Glucose: 306 (calc)
eAG (mmol/L): 17 (calc)

## 2019-02-22 NOTE — Telephone Encounter (Signed)
-----   Message from [name], NP sent at 02/21/2019  3:42 PM EST ----- Needs an endocrinologist  dr Cruzita Lederer  type 2 diabetic on insulin for 5 years, last hgb a1c was 11  09/2018.  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

## 2019-02-22 NOTE — Telephone Encounter (Signed)
Referral placed at [facility], they will call to schedule. Left detailed message on patient cell of referral information.

## 2019-02-25 NOTE — Telephone Encounter (Signed)
Patient scheduled on 03/24/19 with Dr. Cruzita Lederer

## 2019-02-26 ENCOUNTER — Encounter: Payer: Self-pay | Admitting: Obstetrics & Gynecology

## 2019-03-02 ENCOUNTER — Encounter: Payer: Self-pay | Admitting: Obstetrics & Gynecology

## 2019-03-24 ENCOUNTER — Telehealth: Payer: Self-pay

## 2019-03-24 ENCOUNTER — Ambulatory Visit: Payer: No Typology Code available for payment source | Admitting: Internal Medicine

## 2019-03-24 NOTE — Telephone Encounter (Signed)
Izora Gala, your email was returned unread.  "Alixandrea, this is just follow-up have you had an appointment with your endocrinologist? Hemoglobin A1c is much too high. Let me know if you are having difficulty getting an appointment. Izora Gala"   Patient had an appointment scheduled for 03/24/19 and 03/25/19 with Dr. Lilia Argue and both have been cancelled. Currently no appointment scheduled with [facility] endocrinology.  Just FYI

## 2019-03-24 NOTE — Telephone Encounter (Signed)
Message forwarded to [place] in a telephone encounter as patient has cancelled two endocrinology appts and is not currently scheduled.

## 2019-03-25 ENCOUNTER — Ambulatory Visit: Payer: No Typology Code available for payment source | Admitting: Internal Medicine

## 2019-04-01 MED FILL — TRULICITY 1.5 MG/0.5 ML PEN: 1.5 | 28 days supply | Qty: 2 | Fill #0

## 2019-04-14 NOTE — Telephone Encounter (Signed)
Telephone call to [facility] after multiple attempts, states has seen her primary care and is now on 2 different medications and does have follow-up scheduled.  Is working to get her hemoglobin A1c down is aware of the urgency.

## 2019-05-05 MED FILL — ATORVASTATIN 10 MG TABLET: 10 | 90 days supply | Qty: 90 | Fill #0

## 2019-05-23 MED FILL — BASAGLAR 100 UNIT/ML KWIKPE: 100 | 28 days supply | Qty: 9 | Fill #0

## 2019-05-23 MED FILL — ATORVASTATIN 10 MG TABLET: 10 | 90 days supply | Qty: 90 | Fill #0

## 2019-05-24 MED FILL — PEG-3350 SOLUTION: 420 | 1 days supply | Qty: 4000 | Fill #0

## 2019-09-08 ENCOUNTER — Encounter: Payer: Self-pay | Admitting: Nurse Practitioner

## 2019-10-03 LAB — HM DIABETES FOOT EXAM: HM Diabetic Foot Exam: NORMAL

## 2019-10-03 LAB — HEMOGLOBIN A1C: Hemoglobin A1C: 12.7

## 2019-10-03 LAB — MICROALBUMIN, URINE: Microalb, Ur: 1.24

## 2019-10-19 ENCOUNTER — Encounter: Payer: Self-pay | Admitting: Internal Medicine

## 2019-10-19 LAB — LAB REPORT - SCANNED
Creatinine Random, Urine: 144
Microalb Creat Ratio: 8.6

## 2019-12-05 ENCOUNTER — Ambulatory Visit: Payer: No Typology Code available for payment source | Admitting: Internal Medicine

## 2019-12-21 ENCOUNTER — Encounter: Payer: Self-pay | Admitting: Internal Medicine

## 2019-12-21 ENCOUNTER — Ambulatory Visit: Payer: No Typology Code available for payment source | Admitting: Internal Medicine

## 2019-12-21 ENCOUNTER — Other Ambulatory Visit: Payer: Self-pay

## 2019-12-21 VITALS — BP 126/88 | HR 96 | Ht 62.0 in | Wt 171.8 lb

## 2019-12-21 DIAGNOSIS — E1142 Type 2 diabetes mellitus with diabetic polyneuropathy: Secondary | ICD-10-CM | POA: Diagnosis not present

## 2019-12-21 DIAGNOSIS — E1165 Type 2 diabetes mellitus with hyperglycemia: Secondary | ICD-10-CM | POA: Insufficient documentation

## 2019-12-21 DIAGNOSIS — E042 Nontoxic multinodular goiter: Secondary | ICD-10-CM | POA: Insufficient documentation

## 2019-12-21 DIAGNOSIS — Z794 Long term (current) use of insulin: Secondary | ICD-10-CM | POA: Diagnosis not present

## 2019-12-21 DIAGNOSIS — E119 Type 2 diabetes mellitus without complications: Secondary | ICD-10-CM | POA: Diagnosis not present

## 2019-12-21 DIAGNOSIS — E785 Hyperlipidemia, unspecified: Secondary | ICD-10-CM | POA: Insufficient documentation

## 2019-12-21 LAB — POCT GLYCOSYLATED HEMOGLOBIN (HGB A1C): Hemoglobin A1C: 11.5 % — AB (ref 4.0–5.6)

## 2019-12-21 LAB — POCT GLUCOSE (DEVICE FOR HOME USE): Glucose Fasting, POC: 299 mg/dL — AB (ref 70–99)

## 2019-12-21 MED ORDER — RYBELSUS 3 MG PO TABS: 3.0000 mg | ORAL_TABLET | Freq: Every morning | ORAL | 1 refills | Status: DC

## 2019-12-21 MED ORDER — INSULIN PEN NEEDLE 32G X 4 MM MISC: 1.0000 | Freq: Two times a day (BID) | 3 refills | Status: DC

## 2019-12-21 MED ORDER — INSULIN LISPRO PROT & LISPRO (75-25 MIX) 100 UNIT/ML KWIKPEN: 14.0000 [IU] | PEN_INJECTOR | Freq: Two times a day (BID) | SUBCUTANEOUS | 3 refills | Status: DC

## 2019-12-21 MED FILL — HUMALOG MIX 75-25 KWIKPEN: (75-25) 100 | 85 days supply | Qty: 24 | Fill #0

## 2019-12-21 MED FILL — RYBELSUS 3 MG TABS: 3 | 90 days supply | Qty: 90 | Fill #0

## 2019-12-21 NOTE — Progress Notes (Signed)
Name: Wanda [name]  MRN/ DOB: [id number], 07/31/1980   Age/ Sex: 39 y.o., female    PCP: Donald Prose, MD   Reason for Endocrinology Evaluation: Type 2 Diabetes Mellitus     Date of Initial Endocrinology Visit: 12/21/2019     PATIENT IDENTIFIER: Wanda [name] is a 39 y.o. female with a past medical history of T2DM , dyslipidemia and MNG. The patient presented for initial endocrinology clinic visit on 12/21/2019 for consultative assistance with her diabetes management.    HPI: Wanda [name] was    Diagnosed with DM in 2012 Prior Medications tried/Intolerance: Invokana- does not recall any side effects, Trulicity- nausea  , Metformin - GI side effects  Currently checking blood sugars 0 x / day Hypoglycemia episodes : no              Hemoglobin A1c has ranged from 11.8%in 2016, peaking at 12.7% in 2021. Patient required assistance for hypoglycemia: no Patient has required hospitalization within the last 1 year from hyper or hypoglycemia: no  In terms of diet, the patient eats 2 meals a day, snacks 3x a day. Avoids sugar sweetened beverages       THYROID HISTORY: She has been diagnosed with MNG in 2017. She is S/P benign FNA of the right superior and right nferior nodules.  She denies any local worsening of enlargement     HOME DIABETES REGIMEN: Lantus - took it a month ago.  Trulicity - stopped taking it a month ago     Statin: yes ACE-I/ARB: no Prior Diabetic Education: yes   METER DOWNLOAD SUMMARY: Did not bring   DIABETIC COMPLICATIONS: Microvascular complications:   Neuropathy  Denies: CKD  Last eye exam: Completed 2020   Macrovascular complications:    Denies: CAD, PVD, CVA   PAST HISTORY: Past Medical History:  Past Medical History:  Diagnosis Date  . Anemia    Hx  . Anxiety    Hx - no current meds  . Depression    hx - no meds currently  . Diabetes mellitus    type 2  . Goiter    recent dx 06/2015 MD is watching, no meds     Past Surgical History:  Past Surgical History:  Procedure Laterality Date  . DILATION AND CURETTAGE OF UTERUS  12/2017  . INNER EAR SURGERY Right    rupture eardrum  . LAPAROSCOPIC UNILATERAL SALPINGECTOMY Left 08/24/2015   Procedure: LAPAROSCOPIC Left  SALPINGECTOMY with  Lysis of  Omental and Left Para Tubal Adhesions;  Surgeon: Princess Bruins, MD;  Location: [facility];  Service: Gynecology;  Laterality: Left;  . oophrectomy     right  . ROBOTIC ASSISTED LAPAROSCOPIC HYSTERECTOMY AND SALPINGECTOMY  08/24/2015   Procedure: ROBOTIC ASSISTED LAPAROSCOPIC  Left SALPINGECTOMY;  Surgeon: Princess Bruins, MD;  Location: [facility];  Service: Gynecology;;      Social History:  reports that she has never smoked. She has never used smokeless tobacco. She reports current alcohol use. She reports that she does not use drugs.    Family History:  Family History  Problem Relation Age of Onset  . Cancer Father        multiple myeloma  . Multiple myeloma Father   . Hypertension Father   . Hypertension Mother   . Cancer Paternal Aunt        colon  . Breast cancer Maternal Grandmother   . Breast cancer Paternal Grandmother   . Diabetes Neg Hx   . Thyroid disease  Neg Hx      HOME MEDICATIONS: Allergies as of 12/21/2019      Reactions   Metformin And Related    diarrhea      Medication List       Accurate as of December 21, 2019  8:00 AM. If you have any questions, ask your nurse or doctor.        Dulaglutide 1.5 MG/0.5ML Sopn Inject 1.5 mg into the skin once a week.   insulin glargine 100 UNIT/ML injection Commonly known as: LANTUS Inject 14 Units into the skin at bedtime.        ALLERGIES: Allergies  Allergen Reactions  . Metformin And Related     diarrhea     REVIEW OF SYSTEMS: A comprehensive ROS was conducted with the patient and is negative except as per HPI and below:  Review of Systems  Gastrointestinal: Negative for diarrhea and nausea.  Genitourinary:  Negative for frequency.  Endo/Heme/Allergies: Positive for polydipsia.      OBJECTIVE:   VITAL SIGNS: BP 126/88 (BP Location: Left Arm, Patient Position: Sitting, Cuff Size: Small)   Pulse 96   Ht _0  (1.575 m)   Wt 171 lb 12.8 oz (77.9 kg)   SpO2 98%   BMI 31.42 kg/m    PHYSICAL EXAM:  General: Pt appears well and is in NAD  Neck: General: Supple without adenopathy or carotid bruits. Thyroid: Thyroid size enlarged ~ 80 grams .  Nodules appreciate  Lungs: Clear with good BS bilat with no rales, rhonchi, or wheezes  Heart: RRR with normal S1 and S2 and no gallops; no murmurs; no rub  Abdomen: Normoactive bowel sounds, soft, nontender, without masses or organomegaly palpable  Extremities:  Lower extremities - No pretibial edema. No lesions.  Skin: Normal texture and temperature to palpation.   Neuro: MS is good with appropriate affect, pt is alert and Ox3    DM foot exam: 12/21/2019  The skin of the feet is intact without sores or ulcerations. The pedal pulses are 2+ on right and 2+ on left. The sensation is intact to a screening 5.07, 10 gram monofilament bilaterally        DATA REVIEWED:  05/03/2019 HDL 57 mg/dL LDL 129 mg/dL Tg 96       Lab Results  Component Value Date   HGBA1C 11.5 (A) 12/21/2019   HGBA1C 12.7 10/03/2019   HGBA1C 12.3 (H) 02/21/2019   Lab Results  Component Value Date   MICROALBUR 1.24 10/03/2019   CREATININE 0.58 08/23/2015   Lab Results  Component Value Date   MICRALBCREAT 8.6 10/03/2019    FNA 07/04/2019 THYROID, FINE NEEDLE ASPIRATION, RIGHT SUPERIOR NODULE (SPECIMEN 1 OF 2, COLLECTED ON 07/04/15): CONSISTENT WITH BENIGN FOLLICULAR NODULE (BETHESDA CATEGORY II).     FNA 07/04/2015 THYROID, FINE NEEDLE ASPIRATION, RIGHT INFERIOR (SPECIMEN 2 OF 2, COLLECTED ON 07/04/15): CONSISTENT WITH BENIGN FOLLICULAR NODULE (BETHESDA CATEGORY II).   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 Goal A1c < 7.0 %.     Plan: GENERAL:  Poorly controlled diabetes due to dietary indiscretions and medication non-adherence I have discussed with the patient the pathophysiology of diabetes. We went over the natural progression of the disease. We talked about both insulin resistance and insulin deficiency. We stressed the importance of lifestyle changes including diet and exercise. I  explained the complications associated with diabetes including retinopathy, nephropathy, neuropathy as well as increased risk of cardiovascular disease. We went over the benefit seen with glycemic control.    I explained to the patient that diabetic patients are at higher than normal risk for amputations.  We emphasized the importance of avoiding sugar sweetened beverages and avoiding snacks when possible, low carb snacks discussed   I went over with the patient various strategies as to how she can be reminded to take the second dose of both medications during the day given her schedule  Will try switching Trulicity to Rybelsus, she was cautioned against GI side effects    MEDICATIONS:  Stop Lantus  - Stop Trulicity  - Start Rybelsus 3 mg , 1 tablet daily before breakfast  - Start Humalog MIX 14 units with Breakfast and 14 units with Supper    EDUCATION / INSTRUCTIONS:  BG monitoring instructions: Patient is instructed to check her blood sugars 2 times a day,before breakfast and supper when possible .  Call [facility] if: BG persistently < 70  . I reviewed the Rule of 15 for the treatment of hypoglycemia in detail with the patient. Literature supplied.   2) Diabetic complications:   Eye: Does not have known diabetic retinopathy.   Neuro/ Feet: Does  have known diabetic peripheral neuropathy.  Renal: Patient does not have known baseline CKD. She is not on  an ACEI/ARB at present   3) Lipids: Patient is  on atorvastatin 10 mg daily . LDL in February 129 mg/dL . Pt admits to non-compliance . Discussed cardiovascular benefits    4) MNG:   - Pt is clinically euthyroid  - No local neck symptoms  - S/P benign FNA of the right superior and inferior nodules in 2017, will proceed with repeat US for stability      F/U in 3 months   Signed electronically by: Mack Guise, MD  [facility]  [facility] [address], [place] [address] Phone: [phone] FAX: [phone]   CC: Donald Prose, [address] [place] [address] [place] [address] Phone: [phone]  Fax: [phone]    Return to Endocrinology clinic as below: Future Appointments  Date Time Provider [place]  02/22/2020  9:00 AM Tamela Gammon, NP [facility]um[facility]

## 2019-12-21 NOTE — Patient Instructions (Addendum)
-   Stop Lantus  - Stop Trulicity  - Start Rybelsus 3 mg , 1 tablet daily before breakfast  - Start Humalog MIX 14 units with Breakfast and 14 units with Supper    Choose healthy, lower carb lower calorie snacks: toss salad, vegetables, cottage cheese, peanut butter, low fat cheese / string cheese, lower sodium deli meat, tuna salad or chicken salad     HOW TO TREAT LOW BLOOD SUGARS (Blood sugar LESS THAN 70 MG/DL)  Please follow the RULE OF 15 for the treatment of hypoglycemia treatment (when your (blood sugars are less than 70 mg/dL)    STEP 1: Take 15 grams of carbohydrates when your blood sugar is low, which includes:   3-4 GLUCOSE TABS  OR  3-4 OZ OF JUICE OR REGULAR SODA OR  ONE TUBE OF GLUCOSE GEL     STEP 2: RECHECK blood sugar in 15 MINUTES STEP 3: If your blood sugar is still low at the 15 minute recheck --> then, go back to STEP 1 and treat AGAIN with another 15 grams of carbohydrates.

## 2019-12-22 MED FILL — ATORVASTATIN CALCIUM 10 MG: 10 | 90 days supply | Qty: 90 | Fill #0

## 2019-12-27 ENCOUNTER — Other Ambulatory Visit: Payer: Self-pay | Admitting: Internal Medicine

## 2019-12-27 MED ORDER — OZEMPIC (0.25 OR 0.5 MG/DOSE) 2 MG/1.5ML ~~LOC~~ SOPN: 0.2500 mg | PEN_INJECTOR | SUBCUTANEOUS | 6 refills | Status: DC

## 2019-12-27 MED FILL — OZEMPIC 0.25 OR 0.5 MG/DOSE: 2 | 56 days supply | Qty: 2 | Fill #0

## 2020-02-22 ENCOUNTER — Encounter: Payer: No Typology Code available for payment source | Admitting: Nurse Practitioner

## 2020-03-22 ENCOUNTER — Ambulatory Visit: Payer: No Typology Code available for payment source | Admitting: Internal Medicine

## 2020-03-22 NOTE — Progress Notes (Deleted)
Name: Wanda [name]  Age/ Sex: 40 y.o., female   MRN/ DOB: [id number], November 13, 1980     PCP: Deatra James, MD   Reason for Endocrinology Evaluation: Type 2 Diabetes Mellitus  Initial Endocrine Consultative Visit: 12/21/2019    PATIENT IDENTIFIER: Wanda [name] is a 39 y.o. female with a past medical history of T2DM , dyslipidemia and MNG. The patient has followed with Endocrinology clinic since 12/21/2019 for consultative assistance with management of her diabetes.  DIABETIC HISTORY:  Ms. Carriero was diagnosed with DM in 2012. Invokana- does not recall any side effects, Trulicity- nausea  , Metformin - GI side effects . Her hemoglobin A1c has ranged from 11.8%in 2016, peaking at 12.7% in 2021.  On her initial visit to our clinic she had an A1c 11.5%. She was on basal insulin that she would take sporadically and Trulicity that she had not taken for a month prior to her visit. We started her on Rybelsus and Humalog mix.     THYROID HISTORY: She has been diagnosed with MNG in 2017. She is S/P benign FNA of the right superior and right nferior nodules.  She denies any local worsening of enlargement    SUBJECTIVE:   During the last visit (12/21/2019): A1c 11.5% . She was on basal insulin that she would take sporadically and Trulicity that she had not taken for a month prior to her visit. We started her on Rybelsus and Humalog mix.      Today (03/22/2020): Ms. Brumbaugh is here for a follow up on diabetes.  She checks her blood sugars *** times daily, preprandial to breakfast and ***. The patient has *** had hypoglycemic episodes since the last clinic visit, which typically occur *** x / - most often occuring ***. The patient is *** symptomatic with these episodes, with symptoms of {symptoms; hypoglycemia:9084048}.      HOME DIABETES REGIMEN:  Rybelsus 3 mg , 1 tablet daily before breakfast  Humalog MIX 14 units with Breakfast and 14 units with Supper      Statin:  yes ACE-I/ARB: no    METER DOWNLOAD SUMMARY: Date range evaluated: *** Fingerstick Blood Glucose Tests = *** Average Number Tests/Day = *** Overall Mean FS Glucose = *** Standard Deviation = ***  BG Ranges: Low = *** High = ***   Hypoglycemic Events/30 Days: BG < 50 = *** Episodes of symptomatic severe hypoglycemia = ***    DIABETIC COMPLICATIONS: Microvascular complications:   Neuropathy  Denies: CKD  Last Eye Exam: Completed 2020  Macrovascular complications:    Denies: CAD, CVA, PVD   HISTORY:  Past Medical History:  Past Medical History:  Diagnosis Date  . Anemia    Hx  . Anxiety    Hx - no current meds  . Depression    hx - no meds currently  . Diabetes mellitus    type 2  . Goiter    recent dx 06/2015 MD is watching, no meds     Past Surgical History:  Past Surgical History:  Procedure Laterality Date  . DILATION AND CURETTAGE OF UTERUS  12/2017  . INNER EAR SURGERY Right    rupture eardrum  . LAPAROSCOPIC UNILATERAL SALPINGECTOMY Left 08/24/2015   Procedure: LAPAROSCOPIC Left  SALPINGECTOMY with  Lysis of  Omental and Left Para Tubal Adhesions;  Surgeon: Genia Del, MD;  Location: [facility];  Service: Gynecology;  Laterality: Left;  . oophrectomy     right  . ROBOTIC ASSISTED LAPAROSCOPIC HYSTERECTOMY AND SALPINGECTOMY  08/24/2015   Procedure: ROBOTIC ASSISTED LAPAROSCOPIC  Left SALPINGECTOMY;  Surgeon: Princess Bruins, MD;  Location: [facility];  Service: Gynecology;;     Social History:  reports that she has never smoked. She has never used smokeless tobacco. She reports current alcohol use. She reports that she does not use drugs. Family History:  Family History  Problem Relation Age of Onset  . Cancer Father        multiple myeloma  . Multiple myeloma Father   . Hypertension Father   . Hypertension Mother   . Cancer Paternal Aunt        colon  . Breast cancer Maternal Grandmother   . Breast cancer Paternal Grandmother   .  Diabetes Neg Hx   . Thyroid disease Neg Hx       HOME MEDICATIONS: Allergies as of 03/22/2020      Reactions   Metformin And Related    diarrhea      Medication List       Accurate as of March 22, 2020  7:26 AM. If you have any questions, ask your nurse or doctor.        Insulin Lispro Prot & Lispro (75-25) 100 UNIT/ML Kwikpen Commonly known as: HumaLOG Mix 75/25 KwikPen Inject 14 Units into the skin 2 (two) times daily with a meal.   Insulin Pen Needle 32G X 4 MM Misc 1 Device by Does not apply route 2 (two) times daily.   Ozempic (0.25 or 0.5 MG/DOSE) 2 MG/1.5ML Sopn Generic drug: Semaglutide(0.25 or 0.5MG /DOS) Inject 0.25 mg into the skin once a week.        OBJECTIVE:   Vital Signs: There were no vitals taken for this visit.  Wt Readings from Last 3 Encounters:  12/21/19 171 lb 12.8 oz (77.9 kg)  02/21/19 176 lb (79.8 kg)  11/13/16 185 lb 12.8 oz (84.3 kg)     Exam: General: Pt appears well and is in NAD  Neck: General: Supple without adenopathy. Thyroid: Thyroid size normal.  No goiter or nodules appreciated. No thyroid bruit.  Lungs: Clear with good BS bilat with no rales, rhonchi, or wheezes  Heart: RRR with normal S1 and S2 and no gallops; no murmurs; no rub  Abdomen: Normoactive bowel sounds, soft, nontender, without masses or organomegaly palpable  Extremities: No pretibial edema.   Neuro: MS is good with appropriate affect, pt is alert and Ox3    DM foot exam: 12/21/2019  The skin of the feet is intact without sores or ulcerations. The pedal pulses are 2+ on right and 2+ on left. The sensation is intact to a screening 5.07, 10 gram monofilament bilaterally    DATA REVIEWED:  Lab Results  Component Value Date   HGBA1C 11.5 (A) 12/21/2019   HGBA1C 12.7 10/03/2019   HGBA1C 12.3 (H) 02/21/2019   Lab Results  Component Value Date   MICROALBUR 1.24 10/03/2019   CREATININE 0.58 08/23/2015   Lab Results  Component Value Date    MICRALBCREAT 8.6 10/03/2019    05/03/2019 HDL 57 mg/dL LDL 129 mg/dL Tg 96  FNA 07/04/2019 THYROID, FINE NEEDLE ASPIRATION, RIGHT SUPERIOR NODULE (SPECIMEN 1 OF 2, COLLECTED ON 07/04/15): CONSISTENT WITH BENIGN FOLLICULAR NODULE (BETHESDA CATEGORY II).     FNA 07/04/2015 THYROID, FINE NEEDLE ASPIRATION, RIGHT INFERIOR (SPECIMEN 2 OF 2, COLLECTED ON 07/04/15): CONSISTENT WITH BENIGN FOLLICULAR NODULE (BETHESDA CATEGORY II).    ASSESSMENT / PLAN / RECOMMENDATIONS:   1)Type 2 Diabetes Mellitus, Poorly controlled, With Neuropathic  complications - Most recent A1c of 11.5 %. Goal A1c < 7.0 %.    Plan: MEDICATIONS:  ***  EDUCATION / INSTRUCTIONS:  BG monitoring instructions: Patient is instructed to check her blood sugars *** times a day, ***.  Call [facility] if: BG persistently < 70 or > 300. . I reviewed the Rule of 15 for the treatment of hypoglycemia in detail with the patient. Literature supplied.  2) Diabetic complications:   Eye: Does not have known diabetic retinopathy.   Neuro/ Feet: Does  have known diabetic peripheral neuropathy.  Renal: Patient does not have known baseline CKD. She is not on an ACEI/ARB at present   3) Lipids: Patient is  on atorvastatin 10 mg daily . LDL in February 129 mg/dL . Pt admits to non-compliance . Discussed cardiovascular benefits    4) MNG:   - Pt is clinically euthyroid  - No local neck symptoms  - S/P benign FNA of the right superior and inferior nodules in 2017, will proceed with repeat US for stability     F/U in ***    Signed electronically by: Mack Guise, MD  [facility]  [address] [place], [place] [address] Phone: [phone] FAX: [phone]   CC: [address], [address] [place] [address] Phone: [phone]  Fax: [phone]  Return to Endocrinology clinic as below: Future  Appointments  Date Time Provider Hugo  03/22/2020  7:50 AM Victorio Creeden, Melanie Crazier, MD [facility] None

## 2020-05-04 ENCOUNTER — Other Ambulatory Visit: Payer: Self-pay | Admitting: Internal Medicine

## 2021-07-25 ENCOUNTER — Other Ambulatory Visit: Payer: Self-pay | Admitting: Internal Medicine

## 2021-07-25 ENCOUNTER — Encounter: Payer: Self-pay | Admitting: Internal Medicine

## 2021-07-25 ENCOUNTER — Ambulatory Visit (INDEPENDENT_AMBULATORY_CARE_PROVIDER_SITE_OTHER): Payer: 59 | Admitting: Internal Medicine

## 2021-07-25 VITALS — BP 132/88 | HR 100 | Ht 62.0 in | Wt 169.0 lb

## 2021-07-25 DIAGNOSIS — E1165 Type 2 diabetes mellitus with hyperglycemia: Secondary | ICD-10-CM | POA: Diagnosis not present

## 2021-07-25 DIAGNOSIS — R739 Hyperglycemia, unspecified: Secondary | ICD-10-CM

## 2021-07-25 DIAGNOSIS — E042 Nontoxic multinodular goiter: Secondary | ICD-10-CM | POA: Diagnosis not present

## 2021-07-25 DIAGNOSIS — N760 Acute vaginitis: Secondary | ICD-10-CM | POA: Diagnosis not present

## 2021-07-25 DIAGNOSIS — E1142 Type 2 diabetes mellitus with diabetic polyneuropathy: Secondary | ICD-10-CM

## 2021-07-25 DIAGNOSIS — Z794 Long term (current) use of insulin: Secondary | ICD-10-CM

## 2021-07-25 LAB — POCT GLYCOSYLATED HEMOGLOBIN (HGB A1C): Hemoglobin A1C: 13.1 % — AB (ref 4.0–5.6)

## 2021-07-25 LAB — POCT GLUCOSE (DEVICE FOR HOME USE): Glucose Fasting, POC: 315 mg/dL — AB (ref 70–99)

## 2021-07-25 MED ORDER — NOVOLOG FLEXPEN 100 UNIT/ML ~~LOC~~ SOPN
8.0000 [IU] | PEN_INJECTOR | Freq: Three times a day (TID) | SUBCUTANEOUS | 2 refills | Status: DC
Start: 2021-07-25 — End: 2021-07-26

## 2021-07-25 MED ORDER — OZEMPIC (0.25 OR 0.5 MG/DOSE) 2 MG/1.5ML ~~LOC~~ SOPN: 0.5000 mg | PEN_INJECTOR | SUBCUTANEOUS | 1 refills | Status: DC

## 2021-07-25 MED ORDER — FLUCONAZOLE 150 MG PO TABS: 150.0000 mg | ORAL_TABLET | Freq: Once | ORAL | 0 refills | Status: AC

## 2021-07-25 MED ORDER — LANTUS SOLOSTAR 100 UNIT/ML ~~LOC~~ SOPN: 20.0000 [IU] | PEN_INJECTOR | Freq: Every day | SUBCUTANEOUS | 2 refills | Status: DC

## 2021-07-25 MED ORDER — ONETOUCH VERIO VI STRP
1.0000 | ORAL_STRIP | Freq: Three times a day (TID) | 2 refills | Status: AC
Start: 2021-07-25 — End: ?

## 2021-07-25 MED ORDER — INSULIN PEN NEEDLE 32G X 4 MM MISC: 1.0000 | Freq: Four times a day (QID) | 3 refills | Status: DC

## 2021-07-25 NOTE — Patient Instructions (Signed)
-   Start Lantus 20 units daily  - Start Novolog 8 units with each meal  - Start Ozempic 0.25 mg weekly for 6 weeks, than increase to 0.5 mg weekly      HOW TO TREAT LOW BLOOD SUGARS (Blood sugar LESS THAN 70 MG/DL) Please follow the RULE OF 15 for the treatment of hypoglycemia treatment (when your (blood sugars are less than 70 mg/dL)   STEP 1: Take 15 grams of carbohydrates when your blood sugar is low, which includes:  3-4 GLUCOSE TABS  OR 3-4 OZ OF JUICE OR REGULAR SODA OR ONE TUBE OF GLUCOSE GEL    STEP 2: RECHECK blood sugar in 15 MINUTES STEP 3: If your blood sugar is still low at the 15 minute recheck --> then, go back to STEP 1 and treat AGAIN with another 15 grams of carbohydrates.

## 2021-07-25 NOTE — Progress Notes (Signed)
Name: Wanda [name]  MRN/ DOB: [id number], Sep 14, 1980   Age/ Sex: 41 y.o., female    PCP: Donald Prose, MD   Reason for Endocrinology Evaluation: Type 2 Diabetes Mellitus     Date of Initial Endocrinology Visit: 12/21/2019    PATIENT IDENTIFIER: [name] is a 41 y.o. female with a past medical history of T2DM , dyslipidemia and MNG. The patient presented for initial endocrinology clinic visit on 12/21/2019 for consultative assistance with her diabetes management.    HPI: Wanda [name] was    Diagnosed with DM in 2012 Prior Medications tried/Intolerance: Invokana- does not recall any side effects, Trulicity- nausea  , Metformin - GI side effects            Hemoglobin A1c has ranged from 11.8%in 2016, peaking at 12.7% in 2021.    THYROID HISTORY: She has been diagnosed with MNG in 2017. She is S/P benign FNA of the right superior and right nferior nodules.  She denies any local worsening of enlargement   SUBJECTIVE:   During the last visit (12/2019): A1c 11.5% we stopped Lantus, Trulicity, started Rybelsus and Humalog Mix  Today (07/25/21): Wanda [name] is here for follow-up on diabetes management.  She has not been to our clinic in 19 months.  She checks her blood sugars 0 times daily.  She has not taken medications for months   Has vaginal itching  Her neuropathy have been aggravating  Denies nausea, vomiting and diarrhea  Denies local neck symptoms    HOME DIABETES REGIMEN: N/A   Statin: yes ACE-I/ARB: no Prior Diabetic Education: yes   METER DOWNLOAD SUMMARY: Did not bring   DIABETIC COMPLICATIONS: Microvascular complications:  Neuropathy Denies: CKD  Last eye exam: Completed 2020   Macrovascular complications:   Denies: CAD, PVD, CVA   PAST HISTORY: Past Medical History:  Past Medical History:  Diagnosis Date   Anemia    Hx   Anxiety    Hx - no current meds   Depression    hx - no meds currently   Diabetes mellitus     type 2   Goiter    recent dx 06/2015 MD is watching, no meds    Past Surgical History:  Past Surgical History:  Procedure Laterality Date   DILATION AND CURETTAGE OF UTERUS  12/2017   INNER EAR SURGERY Right    rupture eardrum   LAPAROSCOPIC UNILATERAL SALPINGECTOMY Left 08/24/2015   Procedure: LAPAROSCOPIC Left  SALPINGECTOMY with  Lysis of  Omental and Left Para Tubal Adhesions;  Surgeon: Princess Bruins, MD;  Location: [facility];  Service: Gynecology;  Laterality: Left;   oophrectomy     right   ROBOTIC ASSISTED LAPAROSCOPIC HYSTERECTOMY AND SALPINGECTOMY  08/24/2015   Procedure: ROBOTIC ASSISTED LAPAROSCOPIC  Left SALPINGECTOMY;  Surgeon: Princess Bruins, MD;  Location: [facility];  Service: Gynecology;;    Social History:  reports that she has never smoked. She has never used smokeless tobacco. She reports current alcohol use. She reports that she does not use drugs.    Family History:  Family History  Problem Relation Age of Onset   Cancer Father        multiple myeloma   Multiple myeloma Father    Hypertension Father    Hypertension Mother    Cancer Paternal Aunt        colon   Breast cancer Maternal Grandmother    Breast cancer Paternal Grandmother    Diabetes Neg Hx  Thyroid disease Neg Hx      HOME MEDICATIONS: Allergies as of 07/25/2021       Reactions   Metformin And Related    diarrhea        Medication List        Accurate as of Jul 25, 2021  1:11 PM. If you have any questions, ask your nurse or doctor.          Insulin Lispro Prot & Lispro (75-25) 100 UNIT/ML Kwikpen Commonly known as: HumaLOG Mix 75/25 KwikPen Inject 14 Units into the skin 2 (two) times daily with a meal.   Insulin Pen Needle 32G X 4 MM Misc 1 Device by Does not apply route 2 (two) times daily.   Ozempic (0.25 or 0.5 MG/DOSE) 2 MG/1.5ML Sopn Generic drug: Semaglutide(0.25 or 0.5MG/DOS) INJECT 0.25MG INTO THE SKIN ONCE WEEKLY         ALLERGIES: Allergies  Allergen  Reactions   Metformin And Related     diarrhea         OBJECTIVE:   VITAL SIGNS: BP 132/88 (BP Location: Left Arm, Patient Position: Sitting, Cuff Size: Large)   Pulse 100   Ht 5' 2" (1.575 m)   Wt 169 lb (76.7 kg)   SpO2 99%   BMI 30.91 kg/m    PHYSICAL EXAM:  General: Pt appears well and is in NAD  Neck: General: Supple without adenopathy or carotid bruits. Thyroid: Thyroid size enlarged ~ 80 grams .  Nodules appreciate  Lungs: Clear with good BS bilat with no rales, rhonchi, or wheezes  Heart: RRR with normal S1 and S2 and no gallops; no murmurs; no rub  Abdomen: Normoactive bowel sounds, soft, nontender, without masses or organomegaly palpable  Extremities:  Lower extremities - No pretibial edema. No lesions.  Neuro: MS is good with appropriate affect, pt is alert and Ox3          DATA REVIEWED:  05/03/2019 HDL 57 mg/dL LDL 129 mg/dL Tg 96       Lab Results  Component Value Date   HGBA1C 13.1 (A) 07/25/2021   HGBA1C 11.5 (A) 12/21/2019   HGBA1C 12.7 10/03/2019   Lab Results  Component Value Date   MICROALBUR 1.24 10/03/2019   CREATININE 0.58 08/23/2015   Lab Results  Component Value Date   MICRALBCREAT 8.6 10/03/2019    FNA 07/04/2019 THYROID, FINE NEEDLE ASPIRATION, RIGHT SUPERIOR NODULE (SPECIMEN 1 OF 2, COLLECTED ON 07/04/15): CONSISTENT WITH BENIGN FOLLICULAR NODULE (BETHESDA CATEGORY II).     FNA 07/04/2015 THYROID, FINE NEEDLE ASPIRATION, RIGHT INFERIOR (SPECIMEN 2 OF 2, COLLECTED ON 07/04/15): CONSISTENT WITH BENIGN FOLLICULAR NODULE (BETHESDA CATEGORY II).  Office BG 315 mg/dL   ASSESSMENT / PLAN / RECOMMENDATIONS:   1) Type 2 Diabetes Mellitus, Poorly controlled, With Neuropathic  complications - Most recent A1c of 13.1 %. Goal A1c < 7.0 %.     -Poorly controlled diabetes due to medication nonadherence and dietary indiscretions -The patient has not been here in almost 2 years -Patient stated that she has lost family members  recently to diabetes and is serious about taking control of her diabetes -We have discussed with the patient that the quickest way to improve glycemic control is to start her on basal/prandial insulin regimen which she is in agreement of -We will restart her on Ozempic as well  MEDICATIONS: Start Lantus 20 units daily Start Humalog 8 units 3 times daily before every meal Start Ozempic 0.25 mg weekly increase to 0.5 mg  weekly after 6 weeks   EDUCATION / INSTRUCTIONS: BG monitoring instructions: Patient is instructed to check her blood sugars 2 times a day,before breakfast and supper when possible . Call [facility] if: BG persistently < 70  I reviewed the Rule of 15 for the treatment of hypoglycemia in detail with the patient. Literature supplied.   2) Diabetic complications:  Eye: Does not have known diabetic retinopathy.  Neuro/ Feet: Does  have known diabetic peripheral neuropathy. Renal: Patient does not have known baseline CKD. She is not on an ACEI/ARB at present   3) MNG:   - Pt is clinically euthyroid  - No local neck symptoms  - S/P benign FNA of the right superior and inferior nodules in 2017, will proceed with repeat US for stability    4) Acute vaginitis:  - Start Fluconazole 150 mg x1  - Pt to f/u with PCP if symptoms don't resolve   F/U in 4 months   Signed electronically by: Mack Guise, MD  [facility] End[facility]  [facility] [address] [place], [place] [address] Phone: [phone] FAX: [phone]   CC: [address], [place] [address] Phone: [phone]  Fax: [phone]    Return to Endocrinology clinic as below: Future Appointments  Date Time Provider [place]  08/16/2021  3:00 PM Princess Bruins, MD [facility] None

## 2021-07-26 ENCOUNTER — Other Ambulatory Visit: Payer: Self-pay | Admitting: Internal Medicine

## 2021-07-26 ENCOUNTER — Other Ambulatory Visit (HOSPITAL_COMMUNITY): Payer: Self-pay

## 2021-07-26 ENCOUNTER — Other Ambulatory Visit (HOSPITAL_BASED_OUTPATIENT_CLINIC_OR_DEPARTMENT_OTHER): Payer: Self-pay

## 2021-07-26 MED ORDER — INSULIN LISPRO (1 UNIT DIAL) 100 UNIT/ML (KWIKPEN)
8.0000 [IU] | PEN_INJECTOR | Freq: Three times a day (TID) | SUBCUTANEOUS | 3 refills | Status: DC
  Filled 2021-07-26: qty 6, 25d supply, fill #0

## 2021-07-26 NOTE — Telephone Encounter (Signed)
Pt insurance does not cover Novolog Flexpen. They DO cover Humalog Kwikpen 100u and will only cover 30 day supply. Pt may only use retail for 3 fills then must use mail order. Please send in new script for Humalog.

## 2021-07-31 ENCOUNTER — Encounter: Payer: Self-pay | Admitting: Internal Medicine

## 2021-07-31 ENCOUNTER — Other Ambulatory Visit (HOSPITAL_BASED_OUTPATIENT_CLINIC_OR_DEPARTMENT_OTHER): Payer: Self-pay

## 2021-08-02 ENCOUNTER — Other Ambulatory Visit (HOSPITAL_BASED_OUTPATIENT_CLINIC_OR_DEPARTMENT_OTHER): Payer: Self-pay

## 2021-08-08 ENCOUNTER — Telehealth: Payer: Self-pay | Admitting: Pharmacy Technician

## 2021-08-08 ENCOUNTER — Other Ambulatory Visit (HOSPITAL_COMMUNITY): Payer: Self-pay

## 2021-08-12 ENCOUNTER — Telehealth: Payer: Self-pay | Admitting: Pharmacy Technician

## 2021-08-12 ENCOUNTER — Other Ambulatory Visit (HOSPITAL_COMMUNITY): Payer: Self-pay

## 2021-08-12 NOTE — Telephone Encounter (Signed)
Patient Advocate Encounter   Received notification from pt/RMA that prior authorization for Ozempic '2mg'$ /47m is required by his/her insurance [employer].    PA submitted on 08/12/21 Key [id number] is pending    [facility]:  Patient Advocate Fax:  [phone]

## 2021-08-13 ENCOUNTER — Other Ambulatory Visit (HOSPITAL_COMMUNITY): Payer: Self-pay

## 2021-08-13 NOTE — Telephone Encounter (Signed)
Patient Advocate Encounter  Prior Authorization for Ozempic '2mg'$ /52m has been approved.    Per Test Claim Patients co-pay is $24.99.   PA# [id number] dates: 07/25/21 through 07/26/22

## 2021-08-16 ENCOUNTER — Ambulatory Visit (INDEPENDENT_AMBULATORY_CARE_PROVIDER_SITE_OTHER): Payer: 59 | Admitting: Obstetrics & Gynecology

## 2021-08-16 ENCOUNTER — Other Ambulatory Visit (HOSPITAL_COMMUNITY)
Admission: RE | Admit: 2021-08-16 | Discharge: 2021-08-16 | Disposition: A | Discharge status: Home / Self Care | Payer: 59 | Source: Ambulatory Visit | Attending: Obstetrics & Gynecology | Admitting: Obstetrics & Gynecology

## 2021-08-16 ENCOUNTER — Encounter: Payer: Self-pay | Admitting: Obstetrics & Gynecology

## 2021-08-16 VITALS — BP 116/70 | HR 99 | Ht 62.25 in | Wt 172.0 lb

## 2021-08-16 DIAGNOSIS — N898 Other specified noninflammatory disorders of vagina: Secondary | ICD-10-CM | POA: Diagnosis not present

## 2021-08-16 DIAGNOSIS — N979 Female infertility, unspecified: Secondary | ICD-10-CM

## 2021-08-16 DIAGNOSIS — Z01419 Encounter for gynecological examination (general) (routine) without abnormal findings: Secondary | ICD-10-CM | POA: Insufficient documentation

## 2021-08-16 DIAGNOSIS — Z113 Encounter for screening for infections with a predominantly sexual mode of transmission: Secondary | ICD-10-CM | POA: Diagnosis not present

## 2021-08-16 LAB — WET PREP FOR TRICH, YEAST, CLUE

## 2021-08-16 MED ORDER — TINIDAZOLE 500 MG PO TABS: 1000.0000 mg | ORAL_TABLET | Freq: Two times a day (BID) | ORAL | 0 refills | Status: AC

## 2021-08-16 NOTE — Progress Notes (Signed)
Wanda [name] [birth year] [id number]   History:    41 y.o. 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  S/P Rt Oophorectomy/Bilateral Salpingectomy. Many rounds of IVF.  Last one ended up with a Spont. Ab in the first trimester.  DM on Insulin and Goiter, Bx benign, followed by Dr Dwyane Dee.  Menses normal every month.  No pelvic pain.  Pap Neg 09/2016.  Pap/HPV HR today.  Increased vaginal d/c with burning.  Desires STI screen. Breasts wnl. Needs to schedule Screening mammo.  BMI 31.21.  Health labs with Fam MD.   Past medical history,surgical history, family history and social history were all reviewed and documented in the EPIC chart.  Gynecologic History Patient's last menstrual period was 08/07/2021 (exact date).  Obstetric History OB History  Gravida Para Term Preterm AB Living  1 0 0 0 1 0  SAB IAB Ectopic Multiple Live Births  1 0 0 0 0    # Outcome Date GA Lbr Len/2nd Weight Sex Delivery Anes PTL Lv  1 SAB              ROS: A ROS was performed and pertinent positives and negatives are included in the history. GENERAL: No fevers or chills. HEENT: No change in vision, no earache, sore throat or sinus congestion. NECK: No pain or stiffness. CARDIOVASCULAR: No chest pain or pressure. No palpitations. PULMONARY: No shortness of breath, cough or wheeze. GASTROINTESTINAL: No abdominal pain, nausea, vomiting or diarrhea, melena or bright red blood per rectum. GENITOURINARY: No urinary frequency, urgency, hesitancy or dysuria. MUSCULOSKELETAL: No joint or muscle pain, no back pain, no recent trauma. DERMATOLOGIC: No rash, no itching, no lesions. ENDOCRINE: No polyuria, polydipsia, no heat or cold intolerance. No recent change in weight. HEMATOLOGICAL: No anemia or easy bruising or bleeding. NEUROLOGIC: No headache, seizures, numbness, tingling or weakness. PSYCHIATRIC: No depression, no loss of interest in  normal activity or change in sleep pattern.     Exam:   BP 116/70   Pulse 99   Ht 5' 2.25" (1.581 m)   Wt 172 lb (78 kg)   LMP 08/07/2021 (Exact Date)   SpO2 99%   BMI 31.21 kg/m   Body mass index is 31.21 kg/m.  General appearance : Well developed well nourished female. No acute distress HEENT: Eyes: no retinal hemorrhage or exudates,  Neck supple, trachea midline, no carotid bruits, no thyroidmegaly Lungs: Clear to auscultation, no rhonchi or wheezes, or rib retractions  Heart: Regular rate and rhythm, no murmurs or gallops Breast:Examined in sitting and supine position were symmetrical in appearance, no palpable masses or tenderness,  no skin retraction, no nipple inversion, no nipple discharge, no skin discoloration, no axillary or supraclavicular lymphadenopathy Abdomen: no palpable masses or tenderness, no rebound or guarding Extremities: no edema or skin discoloration or tenderness  Pelvic: Vulva: Normal             Vagina: No gross lesions.  Increased d/c.  Wet prep done.  Cervix: No gross lesions or discharge.  Pap/HPV HR, Gono-Chlam.  Uterus  AV, normal size, shape and consistency, non-tender and mobile  Adnexa  Without masses or tenderness  Anus: Small non-thrombosed hemorrhoids  Wet prep:  Clue cells present with odor   Assessment/Plan:  41 y.o. female for annual exam   1. 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  S/P Rt Oophorectomy/Bilateral Salpingectomy. Many rounds of IVF.  Last one ended up with a Spont. Ab in the first trimester.  DM on Insulin and Goiter, Bx benign, followed by Dr Dwyane Dee.  Menses normal every month.  No pelvic pain.  Pap Neg 09/2016.  Pap/HPV HR today.  Increased vaginal d/c with burning.  Desires STI screen. Breasts wnl. Needs to schedule Screening mammo.  BMI 31.21.  Health labs with Fam MD. - [facility]  2. Primary female  infertility May adopt.  3. Vaginal discharge BV confirmed by wet prep.  Will treat with Tinidazole.  Usage reviewed, prescription sent to pharmacy. - WET PREP FOR [place], YEAST, CLUE  4. Screen for STD (sexually transmitted disease) - Cytology - PAP( [facility]) - HIV antibody (with reflex) - RPR - Hepatitis B Surface AntiGEN - Hepatitis C Antibody  Other orders - OZEMPIC, 0.25 OR 0.5 MG/DOSE, 2 MG/3ML SOPN; Inject into the skin. - tinidazole (TINDAMAX) 500 MG tablet; Take 2 tablets (1,000 mg total) by mouth 2 (two) times daily for 2 days.   Princess Bruins MD, 3:30 PM 08/16/2021

## 2021-08-19 LAB — RPR: RPR Ser Ql: NONREACTIVE

## 2021-08-19 LAB — CYTOLOGY - PAP
Chlamydia: NEGATIVE
Comment: NEGATIVE
Comment: NEGATIVE
Comment: NORMAL
Diagnosis: NEGATIVE
High risk HPV: NEGATIVE
Neisseria Gonorrhea: NEGATIVE

## 2021-08-19 LAB — HEPATITIS C ANTIBODY
Hepatitis C Ab: NONREACTIVE
SIGNAL TO CUT-OFF: 0.08 (ref <1.00)

## 2021-08-19 LAB — HEPATITIS B SURFACE ANTIGEN: Hepatitis B Surface Ag: NONREACTIVE

## 2021-08-19 LAB — HIV ANTIBODY (ROUTINE TESTING W REFLEX): HIV 1&2 Ab, 4th Generation: NONREACTIVE

## 2021-09-16 ENCOUNTER — Other Ambulatory Visit (HOSPITAL_COMMUNITY): Payer: Self-pay

## 2021-09-16 NOTE — Telephone Encounter (Signed)
Patient Advocate Encounter   Received notification from RMA that prior authorization for Ozmpic is required by his/her insurance [employer].  Per Test Claim: no pa needed at this time copay is $24.99 for 1 month

## 2021-12-02 NOTE — Progress Notes (Unsigned)
Name: Wanda [name]  MRN/ DOB: [id number], 1980-09-19   Age/ Sex: 41 y.o., female    PCP: Donald Prose, MD   Reason for Endocrinology Evaluation: Type 2 Diabetes Mellitus     Date of Initial Endocrinology Visit: 12/21/2019    PATIENT IDENTIFIER: [name] is a 41 y.o. female with a past medical history of T2DM , dyslipidemia and MNG. The patient presented for initial endocrinology clinic visit on 12/21/2019 for consultative assistance with her diabetes management.    HPI: Wanda [name] was    Diagnosed with DM in 2012 Prior Medications tried/Intolerance: Invokana- does not recall any side effects, Trulicity- nausea  , Metformin - GI side effects            Hemoglobin A1c has ranged from 11.8%in 2016, peaking at 12.7% in 2021.    THYROID HISTORY: She has been diagnosed with MNG in 2017. She is S/P benign FNA of the right superior and right nferior nodules.  She denies any local worsening of enlargement   SUBJECTIVE:   During the last visit (07/25/2021): A1c 13.1 %      Today (12/03/21): Wanda [name] is here for follow-up on diabetes management.  She checks her blood sugars 3 times  a week   She has not had thyroid ultrasound that was ordered 07/2021  She takes lantus every now and than because it dropped her glucose to 99 mg/dL  She has not been taking Humalog  She has been taking ozempic once a week but misses a couple of doses   She lives with husband and works from home   She snacks during the day , may eat 2-3 meals a day    Stable local neck swelling   HOME DIABETES REGIMEN: Lantus 20 units daily  Humalog 8 units TIDQAC Ozempic 0.5 mg weekly     Statin: yes ACE-I/ARB: no Prior Diabetic Education: yes   METER DOWNLOAD SUMMARY: Did not bring   DIABETIC COMPLICATIONS: Microvascular complications:  Neuropathy Denies: CKD  Last eye exam: Completed 2020   Macrovascular complications:   Denies: CAD, PVD, CVA   PAST  HISTORY: Past Medical History:  Past Medical History:  Diagnosis Date   Anemia    Hx   Anxiety    Hx - no current meds   Depression    hx - no meds currently   Diabetes mellitus    type 2   Goiter    recent dx 06/2015 MD is watching, no meds    Past Surgical History:  Past Surgical History:  Procedure Laterality Date   DILATION AND CURETTAGE OF UTERUS  12/2017   INNER EAR SURGERY Right    rupture eardrum   LAPAROSCOPIC UNILATERAL SALPINGECTOMY Left 08/24/2015   Procedure: LAPAROSCOPIC Left  SALPINGECTOMY with  Lysis of  Omental and Left Para Tubal Adhesions;  Surgeon: Princess Bruins, MD;  Location: [facility];  Service: Gynecology;  Laterality: Left;   oophrectomy     right   ROBOTIC ASSISTED LAPAROSCOPIC HYSTERECTOMY AND SALPINGECTOMY  08/24/2015   Procedure: ROBOTIC ASSISTED LAPAROSCOPIC  Left SALPINGECTOMY;  Surgeon: Princess Bruins, MD;  Location: [facility];  Service: Gynecology;;    Social History:  reports that she has never smoked. She has never used smokeless tobacco. She reports that she does not currently use alcohol. She reports that she does not use drugs.    Family History:  Family History  Problem Relation Age of Onset   Hypertension Mother    Cancer Father  multiple myeloma   Multiple myeloma Father    Hypertension Father    Cancer Paternal Aunt        colon   Breast cancer Maternal Grandmother    Breast cancer Paternal Grandmother    Multiple myeloma Paternal Grandfather      HOME MEDICATIONS: Allergies as of 12/03/2021       Reactions   Metformin And Related    diarrhea        Medication List        Accurate as of December 03, 2021  8:00 AM. If you have any questions, ask your nurse or doctor.          insulin lispro 100 UNIT/ML KwikPen Commonly known as: HumaLOG KwikPen Inject 8 Units into the skin 3 (three) times daily.   Insulin Pen Needle 32G X 4 MM Misc 1 Device by Does not apply route in the morning, at noon, in the  evening, and at bedtime.   Lantus SoloStar 100 UNIT/ML Solostar Pen Generic drug: insulin glargine Inject 20 Units into the skin daily.   OneTouch Verio test strip Generic drug: glucose blood 1 each by Other route 3 (three) times daily. Use as instructed   Ozempic (0.25 or 0.5 MG/DOSE) 2 MG/3ML Sopn Generic drug: Semaglutide(0.25 or 0.5MG /DOS) Inject into the skin.         ALLERGIES: Allergies  Allergen Reactions   Metformin And Related     diarrhea         OBJECTIVE:   VITAL SIGNS: BP 134/80 (BP Location: Left Arm, Patient Position: Sitting, Cuff Size: Small)   Pulse 97   Ht 5' 2.25" (1.581 m)   Wt 167 lb (75.8 kg)   SpO2 96%   BMI 30.30 kg/m    PHYSICAL EXAM:  General: Pt appears well and is in NAD  Neck: General: Supple without adenopathy or carotid bruits. Thyroid: Thyroid size enlarged ~ 80 grams .  Nodules appreciate  Lungs: Clear with good BS bilat with no rales, rhonchi, or wheezes  Heart: RRR   Abdomen: Normoactive bowel sounds, soft, nontender, without masses or organomegaly palpable  Extremities:  Lower extremities - No pretibial edema. No lesions.  Neuro: MS is good with appropriate affect, pt is alert and Ox3   DM Foot Exam 12/03/2021   The skin of the feet is intact without sores or ulcerations. The pedal pulses are 2+ on right and 2+ on left. The sensation is intact to a screening 5.07, 10 gram monofilament bilaterally    DATA REVIEWED:   Latest Reference Range & Units 12/03/21 08:26  Sodium 135 - 145 mEq/L 136  Potassium 3.5 - 5.1 mEq/L 4.0  Chloride 96 - 112 mEq/L 103  CO2 19 - 32 mEq/L 27  Glucose 70 - 99 mg/dL 301 (H)  BUN 6 - 23 mg/dL 12  Creatinine 0.40 - 1.20 mg/dL 0.67  Calcium 8.4 - 10.5 mg/dL 9.3  GFR >60.00 mL/min 107.48  MICROALB/CREAT RATIO 0.0 - 30.0 mg/g 1.5    Latest Reference Range & Units 12/03/21 08:26  TSH 0.35 - 5.50 uIU/mL 1.54    Latest Reference Range & Units 12/03/21 08:26  Creatinine,U mg/dL 83.4   Microalb, Ur 0.0 - 1.9 mg/dL 1.3  MICROALB/CREAT RATIO 0.0 - 30.0 mg/g 1.5   Lab Results  Component Value Date   HGBA1C 13.1 (A) 07/25/2021   HGBA1C 11.5 (A) 12/21/2019   HGBA1C 12.7 10/03/2019     FNA 07/04/2019 THYROID, FINE NEEDLE ASPIRATION, RIGHT SUPERIOR NODULE (  SPECIMEN 1 OF 2, COLLECTED ON 07/04/15): CONSISTENT WITH BENIGN FOLLICULAR NODULE (BETHESDA CATEGORY II).     FNA 07/04/2015 THYROID, FINE NEEDLE ASPIRATION, RIGHT INFERIOR (SPECIMEN 2 OF 2, COLLECTED ON 07/04/15): CONSISTENT WITH BENIGN FOLLICULAR NODULE (BETHESDA CATEGORY II).  Office BG 315 mg/dL   ASSESSMENT / PLAN / RECOMMENDATIONS:   1) Type 2 Diabetes Mellitus, Poorly controlled, With Neuropathic  complications - Most recent A1c of 10.4 %. Goal A1c < 7.0 %.      - A1c down 13.1%  -Despite improvement in her A1c, the patient continues with medication nonadherence.  We discussed that a BG reading of 99 mg/DL is normal, I am going to reduce her Lantus dose as below, that twice her glucose does not trend down rapidly, as she is scared of this -She has not been taking her Humalog at all, I am going to prescribe the Dexcom G7 and provide her with a correction scale to be used before each meal -She is tolerating Ozempic, will increase as below -I have again encouraged the patient to avoid sugar sweetened beverages, low-carb diet, and if she has any underlying anxiety or depression it is important to address that -BMP, MA/CR ratio normal  MEDICATIONS: Decrease Lantus 16 units daily Increase  Ozempic 1 mg weekly  Correction factor : Humalog (BG -130/35)   EDUCATION / INSTRUCTIONS: BG monitoring instructions: Patient is instructed to check her blood sugars 2 times a day,before breakfast and supper when possible . Call [facility] if: BG persistently < 70  I reviewed the Rule of 15 for the treatment of hypoglycemia in detail with the patient. Literature supplied.   2) Diabetic complications:   Eye: Does not have known diabetic retinopathy.  Neuro/ Feet: Does  have known diabetic peripheral neuropathy. Renal: Patient does not have known baseline CKD. She is not on an ACEI/ARB at present   3) MNG:   - Pt is clinically euthyroid  - No local neck symptoms  - S/P benign FNA of the right superior and inferior nodules in 2017 -I have ordered thyroid ultrasound on her last visit here back in May, she did not have this done yet, patient will stop by [facility] imaging today to schedule that appointment -TSH normal  F/U in 4 months    I spent 25 minutes preparing to see the patient by review of recent labs, imaging and procedures, obtaining and reviewing separately obtained history, communicating with the patient/family or caregiver, ordering medications, tests or procedures, and documenting clinical information in the EHR including the differential Dx, treatment, and any further evaluation and other management      Signed electronically by: Mack Guise, MD  [facility]  [facility] [address] [place],  [address] Phone: [phone] FAX: [phone]   CC: Donald Prose, [place] [address] Phone: [phone]  Fax: [phone]    Return to Endocrinology clinic as below: No future appointments.

## 2021-12-03 ENCOUNTER — Ambulatory Visit: Payer: 59 | Admitting: Internal Medicine

## 2021-12-03 ENCOUNTER — Encounter: Payer: Self-pay | Admitting: Internal Medicine

## 2021-12-03 VITALS — BP 134/80 | HR 97 | Ht 62.25 in | Wt 167.0 lb

## 2021-12-03 DIAGNOSIS — Z794 Long term (current) use of insulin: Secondary | ICD-10-CM | POA: Diagnosis not present

## 2021-12-03 DIAGNOSIS — E042 Nontoxic multinodular goiter: Secondary | ICD-10-CM

## 2021-12-03 DIAGNOSIS — E1142 Type 2 diabetes mellitus with diabetic polyneuropathy: Secondary | ICD-10-CM

## 2021-12-03 DIAGNOSIS — E1165 Type 2 diabetes mellitus with hyperglycemia: Secondary | ICD-10-CM | POA: Diagnosis not present

## 2021-12-03 LAB — BASIC METABOLIC PANEL
BUN: 12 mg/dL (ref 6–23)
CO2: 27 mEq/L (ref 19–32)
Calcium: 9.3 mg/dL (ref 8.4–10.5)
Chloride: 103 mEq/L (ref 96–112)
Creatinine, Ser: 0.67 mg/dL (ref 0.40–1.20)
GFR: 107.48 mL/min (ref >60.00)
Glucose, Bld: 301 mg/dL — ABNORMAL HIGH (ref 70–99)
Potassium: 4 mEq/L (ref 3.5–5.1)
Sodium: 136 mEq/L (ref 135–145)

## 2021-12-03 LAB — POCT GLYCOSYLATED HEMOGLOBIN (HGB A1C): Hemoglobin A1C: 10.4 % — AB (ref 4.0–5.6)

## 2021-12-03 LAB — TSH: TSH: 1.54 u[IU]/mL (ref 0.35–5.50)

## 2021-12-03 LAB — POCT GLUCOSE (DEVICE FOR HOME USE): Glucose Fasting, POC: 367 mg/dL — AB (ref 70–99)

## 2021-12-03 LAB — MICROALBUMIN / CREATININE URINE RATIO
Creatinine,U: 83.4 mg/dL
Microalb Creat Ratio: 1.5 mg/g (ref 0.0–30.0)
Microalb, Ur: 1.3 mg/dL (ref 0.0–1.9)

## 2021-12-03 MED ORDER — OZEMPIC (1 MG/DOSE) 4 MG/3ML ~~LOC~~ SOPN
1.0000 mg | PEN_INJECTOR | SUBCUTANEOUS | 1 refills | Status: DC
Start: 2021-12-03 — End: 2021-12-03

## 2021-12-03 MED ORDER — LANTUS SOLOSTAR 100 UNIT/ML ~~LOC~~ SOPN: 16.0000 [IU] | PEN_INJECTOR | Freq: Every day | SUBCUTANEOUS | 2 refills | Status: DC

## 2021-12-03 MED ORDER — INSULIN PEN NEEDLE 32G X 4 MM MISC: 1.0000 | Freq: Four times a day (QID) | 3 refills | Status: DC

## 2021-12-03 MED ORDER — OZEMPIC (1 MG/DOSE) 4 MG/3ML ~~LOC~~ SOPN
1.0000 mg | PEN_INJECTOR | SUBCUTANEOUS | 1 refills | Status: DC
  Filled 2021-12-05: qty 3, 28d supply, fill #0
  Filled 2022-03-12: qty 3, 28d supply, fill #1

## 2021-12-03 MED ORDER — INSULIN LISPRO (1 UNIT DIAL) 100 UNIT/ML (KWIKPEN): PEN_INJECTOR | SUBCUTANEOUS | 3 refills | Status: DC

## 2021-12-03 MED ORDER — DEXCOM G7 SENSOR MISC: 1.0000 | 3 refills | Status: AC

## 2021-12-03 NOTE — Patient Instructions (Signed)
-   Take Lantus 16 units daily  - Increase Ozempic 1 mg weekly  -Humalog correctional insulin: Use the scale below to help guide you before each meal   Blood sugar before meal Number of units to inject  Less than 165 0 unit  166 -  200 1 units  201 -  235 2 units  236 -  270 3 units  271 -  305 4 units  306 -  340 5 units  341 -  375 6 units  376 -  410 7 units        HOW TO TREAT LOW BLOOD SUGARS (Blood sugar LESS THAN 70 MG/DL) Please follow the RULE OF 15 for the treatment of hypoglycemia treatment (when your (blood sugars are less than 70 mg/dL)   STEP 1: Take 15 grams of carbohydrates when your blood sugar is low, which includes:  3-4 GLUCOSE TABS  OR 3-4 OZ OF JUICE OR REGULAR SODA OR ONE TUBE OF GLUCOSE GEL    STEP 2: RECHECK blood sugar in 15 MINUTES STEP 3: If your blood sugar is still low at the 15 minute recheck --> then, go back to STEP 1 and treat AGAIN with another 15 grams of carbohydrates.

## 2021-12-05 ENCOUNTER — Other Ambulatory Visit (HOSPITAL_COMMUNITY): Payer: Self-pay

## 2021-12-06 ENCOUNTER — Other Ambulatory Visit (HOSPITAL_COMMUNITY): Payer: Self-pay

## 2021-12-09 ENCOUNTER — Other Ambulatory Visit (HOSPITAL_COMMUNITY): Payer: Self-pay

## 2021-12-11 ENCOUNTER — Other Ambulatory Visit: Payer: Self-pay

## 2021-12-11 MED ORDER — INSULIN LISPRO (1 UNIT DIAL) 100 UNIT/ML (KWIKPEN): PEN_INJECTOR | SUBCUTANEOUS | 3 refills | Status: DC

## 2021-12-12 ENCOUNTER — Other Ambulatory Visit: Payer: Self-pay

## 2021-12-12 MED ORDER — INSULIN LISPRO (1 UNIT DIAL) 100 UNIT/ML (KWIKPEN): PEN_INJECTOR | SUBCUTANEOUS | 3 refills | Status: DC

## 2022-03-12 ENCOUNTER — Other Ambulatory Visit (HOSPITAL_COMMUNITY): Payer: Self-pay

## 2022-05-26 ENCOUNTER — Encounter: Payer: Self-pay | Admitting: Internal Medicine

## 2022-05-26 ENCOUNTER — Ambulatory Visit: Payer: 59 | Admitting: Internal Medicine

## 2022-05-26 VITALS — BP 142/90 | HR 95 | Ht 62.25 in | Wt 175.2 lb

## 2022-05-26 DIAGNOSIS — Z794 Long term (current) use of insulin: Secondary | ICD-10-CM

## 2022-05-26 DIAGNOSIS — E1165 Type 2 diabetes mellitus with hyperglycemia: Secondary | ICD-10-CM | POA: Diagnosis not present

## 2022-05-26 DIAGNOSIS — E1142 Type 2 diabetes mellitus with diabetic polyneuropathy: Secondary | ICD-10-CM

## 2022-05-26 DIAGNOSIS — E042 Nontoxic multinodular goiter: Secondary | ICD-10-CM | POA: Diagnosis not present

## 2022-05-26 DIAGNOSIS — N911 Secondary amenorrhea: Secondary | ICD-10-CM

## 2022-05-26 LAB — BASIC METABOLIC PANEL
BUN: 11 mg/dL (ref 6–23)
CO2: 26 mEq/L (ref 19–32)
Calcium: 8.9 mg/dL (ref 8.4–10.5)
Chloride: 99 mEq/L (ref 96–112)
Creatinine, Ser: 0.55 mg/dL (ref 0.40–1.20)
GFR: 112.34 mL/min (ref >60.00)
Glucose, Bld: 249 mg/dL — ABNORMAL HIGH (ref 70–99)
Potassium: 3.7 mEq/L (ref 3.5–5.1)
Sodium: 135 mEq/L (ref 135–145)

## 2022-05-26 LAB — T4, FREE: Free T4: 0.69 ng/dL (ref 0.60–1.60)

## 2022-05-26 LAB — POCT GLYCOSYLATED HEMOGLOBIN (HGB A1C): Hemoglobin A1C: 10.1 % — AB (ref 4.0–5.6)

## 2022-05-26 LAB — LIPID PANEL
Cholesterol: 192 mg/dL (ref 0–200)
HDL: 68.3 mg/dL (ref >39.00)
LDL Cholesterol: 100 mg/dL — ABNORMAL HIGH (ref 0–99)
NonHDL: 123.26
Total CHOL/HDL Ratio: 3
Triglycerides: 116 mg/dL (ref 0.0–149.0)
VLDL: 23.2 mg/dL (ref 0.0–40.0)

## 2022-05-26 LAB — FOLLICLE STIMULATING HORMONE: FSH: 4.1 m[IU]/mL

## 2022-05-26 LAB — POCT GLUCOSE (DEVICE FOR HOME USE): Glucose Fasting, POC: 255 mg/dL — AB (ref 70–99)

## 2022-05-26 LAB — TSH: TSH: 2.4 u[IU]/mL (ref 0.35–5.50)

## 2022-05-26 MED ORDER — INSULIN LISPRO (1 UNIT DIAL) 100 UNIT/ML (KWIKPEN): PEN_INJECTOR | SUBCUTANEOUS | 3 refills | Status: DC

## 2022-05-26 MED ORDER — LANTUS SOLOSTAR 100 UNIT/ML ~~LOC~~ SOPN: 16.0000 [IU] | PEN_INJECTOR | Freq: Every day | SUBCUTANEOUS | 2 refills | Status: DC

## 2022-05-26 MED ORDER — SEMAGLUTIDE (2 MG/DOSE) 8 MG/3ML ~~LOC~~ SOPN: 2.0000 mg | PEN_INJECTOR | SUBCUTANEOUS | 3 refills | Status: DC

## 2022-05-26 MED ORDER — PIOGLITAZONE HCL 15 MG PO TABS: 15.0000 mg | ORAL_TABLET | Freq: Every day | ORAL | 3 refills | Status: DC

## 2022-05-26 NOTE — Patient Instructions (Signed)
-   Start Pioglitazone (Acots) 15 mg, 1 tablet every morning  - Take Lantus 16 units daily  - Increase Ozempic 2 mg weekly  -Humalog correctional insulin: Use the scale below to help guide you before each meal   Blood sugar before meal Number of units to inject  Less than 165 0 unit  166 -  200 1 units  201 -  235 2 units  236 -  270 3 units  271 -  305 4 units  306 -  340 5 units  341 -  375 6 units  376 -  410 7 units        HOW TO TREAT LOW BLOOD SUGARS (Blood sugar LESS THAN 70 MG/DL) Please follow the RULE OF 15 for the treatment of hypoglycemia treatment (when your (blood sugars are less than 70 mg/dL)   STEP 1: Take 15 grams of carbohydrates when your blood sugar is low, which includes:  3-4 GLUCOSE TABS  OR 3-4 OZ OF JUICE OR REGULAR SODA OR ONE TUBE OF GLUCOSE GEL    STEP 2: RECHECK blood sugar in 15 MINUTES STEP 3: If your blood sugar is still low at the 15 minute recheck --> then, go back to STEP 1 and treat AGAIN with another 15 grams of carbohydrates.

## 2022-05-26 NOTE — Progress Notes (Unsigned)
Name: Wanda [name]  MRN/ DOB: [id number], 11-Mar-1980   Age/ Sex: 42 y.o., female    PCP: Donald Prose, MD   Reason for Endocrinology Evaluation: Type 2 Diabetes Mellitus     Date of Initial Endocrinology Visit: 12/21/2019    PATIENT IDENTIFIER: Wanda [name] is a 42 y.o. female with a past medical history of T2DM , dyslipidemia and MNG. The patient presented for initial endocrinology clinic visit on 12/21/2019 for consultative assistance with her diabetes management.    HPI: [name] was    Diagnosed with DM in 2012 Prior Medications tried/Intolerance: Invokana- does not recall any side effects, Trulicity- nausea  , Metformin - GI side effects            Hemoglobin A1c has ranged from 11.8%in 2016, peaking at 12.7% in 2021.    THYROID HISTORY: She has been diagnosed with MNG in 2017. She is S/P benign FNA of the right superior and right inferior nodules.  She denies any local worsening of enlargement   SUBJECTIVE:   During the last visit (12/03/2021): A1c 10.4 %      Today (05/26/22): [name] is here for follow-up on diabetes management.  She checks her blood sugars 3 times  a week   She is not using the CGM  Stable local neck swelling  She doesn't take Lantus/ humalog because she believe she is on too many shots and it hurts her  LMP 12/2021 Denies galactorrhea , sees Gynecology ( Dr Zenia Resides) Last pelvic exam a year ago  Denies hot flashes   HOME DIABETES REGIMEN: Lantus 16 units daily  Humalog 8 units TIDQAC Ozempic 1 mg weekly     Statin: yes ACE-I/ARB: no Prior Diabetic Education: yes   METER DOWNLOAD SUMMARY: Did not bring   DIABETIC COMPLICATIONS: Microvascular complications:  Neuropathy Denies: CKD  Last eye exam: Completed 02/2022   Macrovascular complications:   Denies: CAD, PVD, CVA   PAST HISTORY: Past Medical History:  Past Medical History:  Diagnosis Date   Anemia    Hx   Anxiety    Hx - no current meds    Depression    hx - no meds currently   Diabetes mellitus    type 2   Goiter    recent dx 06/2015 MD is watching, no meds    Past Surgical History:  Past Surgical History:  Procedure Laterality Date   DILATION AND CURETTAGE OF UTERUS  12/2017   INNER EAR SURGERY Right    rupture eardrum   LAPAROSCOPIC UNILATERAL SALPINGECTOMY Left 08/24/2015   Procedure: LAPAROSCOPIC Left  SALPINGECTOMY with  Lysis of  Omental and Left Para Tubal Adhesions;  Surgeon: Princess Bruins, MD;  Location: [facility];  Service: Gynecology;  Laterality: Left;   oophrectomy     right   ROBOTIC ASSISTED LAPAROSCOPIC HYSTERECTOMY AND SALPINGECTOMY  08/24/2015   Procedure: ROBOTIC ASSISTED LAPAROSCOPIC  Left SALPINGECTOMY;  Surgeon: Princess Bruins, MD;  Location: [facility];  Service: Gynecology;;    Social History:  reports that she has never smoked. She has never used smokeless tobacco. She reports that she does not currently use alcohol. She reports that she does not use drugs.    Family History:  Family History  Problem Relation Age of Onset   Hypertension Mother    Cancer Father        multiple myeloma   Multiple myeloma Father    Hypertension Father    Cancer Paternal Aunt  colon   Breast cancer Maternal Grandmother    Breast cancer Paternal Grandmother    Multiple myeloma Paternal Grandfather      HOME MEDICATIONS: Allergies as of 05/26/2022       Reactions   Metformin And Related    diarrhea        Medication List        Accurate as of May 26, 2022  8:09 AM. If you have any questions, ask your nurse or doctor.          STOP taking these medications    Ozempic (1 MG/DOSE) 4 MG/3ML Sopn Generic drug: Semaglutide (1 MG/DOSE) Replaced by: Semaglutide (2 MG/DOSE) 8 MG/3ML Sopn Stopped by: Dorita Sciara, MD       TAKE these medications    Dexcom G7 Sensor Misc 1 Device by Does not apply route as directed.   insulin lispro 100 UNIT/ML KwikPen Commonly known as:  HumaLOG KwikPen Max daily 30 units Blood sugar before meal Number of units to inject Less than 165 0 unit 166 -  200 1 units 201 -  235 2 units 236 -  270 3 units 271 -  305 4 units 306 -  340 5 units 341 -  375 6 units 376 -  410 7 units   Insulin Pen Needle 32G X 4 MM Misc 1 Device by Does not apply route in the morning, at noon, in the evening, and at bedtime.   Lantus SoloStar 100 UNIT/ML Solostar Pen Generic drug: insulin glargine Inject 16 Units into the skin daily.   OneTouch Verio test strip Generic drug: glucose blood 1 each by Other route 3 (three) times daily. Use as instructed   pioglitazone 15 MG tablet Commonly known as: Actos Take 1 tablet (15 mg total) by mouth daily. Started by: Dorita Sciara, MD   Semaglutide (2 MG/DOSE) 8 MG/3ML Sopn Inject 2 mg as directed once a week. Replaces: Ozempic (1 MG/DOSE) 4 MG/3ML Sopn Started by: Dorita Sciara, MD         ALLERGIES: Allergies  Allergen Reactions   Metformin And Related     diarrhea         OBJECTIVE:   VITAL SIGNS: BP (!) 142/90 (BP Location: Left Arm, Patient Position: Sitting, Cuff Size: Normal)   Pulse 95   Ht 5' 2.25" (1.581 m)   Wt 175 lb 3.2 oz (79.5 kg)   SpO2 97%   BMI 31.79 kg/m    PHYSICAL EXAM:  General: Pt appears well and is in NAD  Neck: General: Supple without adenopathy or carotid bruits. Thyroid: Thyroid size enlarged ~ 80 grams .  Nodules appreciate  Lungs: Clear with good BS bilat with no rales, rhonchi, or wheezes  Heart: RRR   Abdomen: Normoactive bowel sounds, soft, nontender, without masses or organomegaly palpable  Extremities:  Lower extremities - No pretibial edema. No lesions.  Neuro: MS is good with appropriate affect, pt is alert and Ox3   DM Foot Exam 12/03/2021   The skin of the feet is intact without sores or ulcerations. The pedal pulses are 2+ on right and 2+ on left. The sensation is intact to a screening 5.07, 10 gram monofilament  bilaterally    DATA REVIEWED:  Lab Results  Component Value Date   HGBA1C 10.1 (A) 05/26/2022   HGBA1C 10.4 (A) 12/03/2021   HGBA1C 13.1 (A) 07/25/2021     Latest Reference Range & Units 05/26/22 08:16  Sodium 135 - 145 mEq/L  135  Potassium 3.5 - 5.1 mEq/L 3.7  Chloride 96 - 112 mEq/L 99  CO2 19 - 32 mEq/L 26  Glucose 70 - 99 mg/dL 249 (H)  BUN 6 - 23 mg/dL 11  Creatinine 0.40 - 1.20 mg/dL 0.55  Calcium 8.4 - 10.5 mg/dL 8.9  GFR >60.00 mL/min 112.34  Total CHOL/HDL Ratio  3  Cholesterol 0 - 200 mg/dL 192  HDL Cholesterol >39.00 mg/dL 68.30  LDL (calc) 0 - 99 mg/dL 100 (H)  NonHDL  123.26  Triglycerides 0.0 - 149.0 mg/dL 116.0  VLDL 0.0 - 40.0 mg/dL 23.2    Latest Reference Range & Units 05/26/22 08:16  TSH 0.35 - 5.50 uIU/mL 2.40  T4,Free(Direct) 0.60 - 1.60 ng/dL 0.69     Latest Reference Range & Units 12/03/21 08:26  Creatinine,U mg/dL 83.4  Microalb, Ur 0.0 - 1.9 mg/dL 1.3  MICROALB/CREAT RATIO 0.0 - 30.0 mg/g 1.5      FNA 07/04/2019 THYROID, FINE NEEDLE ASPIRATION, RIGHT SUPERIOR NODULE (SPECIMEN 1 OF 2, COLLECTED ON 07/04/15): CONSISTENT WITH BENIGN FOLLICULAR NODULE (BETHESDA CATEGORY II).     FNA 07/04/2015 THYROID, FINE NEEDLE ASPIRATION, RIGHT INFERIOR (SPECIMEN 2 OF 2, COLLECTED ON 07/04/15): CONSISTENT WITH BENIGN FOLLICULAR NODULE (BETHESDA CATEGORY II).  Office BG 255 mg/dL   ASSESSMENT / PLAN / RECOMMENDATIONS:   1) Type 2 Diabetes Mellitus, Poorly controlled, With Neuropathic  complications - Most recent A1c of 10.1 %. Goal A1c < 7.0 %.      - A1c continues to be above goal, pt is not taking her insulin as she believes she is on too much insulin, we discussed the alternatives to persistent hyperglycemia to include risk of blindness, ESRD and amputations  - I did offer Glipizide and Pioglitazone but she changes her mind and opted to take insulin as prescribed   - She is not using CGM, she did not know how to use it, a referral has been placed  to our CDE  -She is tolerating Ozempic, will increase as below - Will start Pioglitazone  -BMP, MA/CR ratio normal 11/2021   MEDICATIONS: Start Pioglitazone 15 mg daily  Restart  Lantus 16 units daily Increase  Ozempic 2 mg weekly  Correction factor : Humalog (BG -130/35)   EDUCATION / INSTRUCTIONS: BG monitoring instructions: Patient is instructed to check her blood sugars 2 times a day,before breakfast and supper when possible . Call [facility] if: BG persistently < 70  I reviewed the Rule of 15 for the treatment of hypoglycemia in detail with the patient. Literature supplied.   2) Diabetic complications:  Eye: Does not have known diabetic retinopathy.  Neuro/ Feet: Does  have known diabetic peripheral neuropathy. Renal: Patient does not have known baseline CKD. She is not on an ACEI/ARB at present   3) MNG:   - Pt is clinically euthyroid  - No local neck symptoms  - S/P benign FNA of the right superior and inferior nodules in 2017 -I have ordered thyroid ultrasound on her last visit here back in May,2023 she did not have this done yet despite my multiple attempts to advise her to schedule this  -TSH normal   4) Secondary Amenorrhea :  - LMP 12/2021 - Home pregnancy test negative - Labs today show normal FSH and TFT   5) Dyslipidemia :  -Patient is not on a statin -LDL above goal, will encourage lifestyle changes, if this remains elevated will start statin therapy   F/U in 4 months  Signed electronically by: Mack Guise, MD  [facility]  [facility] [address], [place], [place] [address] Phone: [phone] FAX: [phone]   CC: Donald Prose, [place] [address] Phone: [phone]  Fax: [phone]    Return to Endocrinology clinic as below: No future appointments.

## 2022-05-27 LAB — HCG, SERUM, QUALITATIVE: Preg, Serum: NEGATIVE

## 2022-05-27 LAB — PROLACTIN: Prolactin: 13.7 ng/mL

## 2022-05-27 LAB — ESTRADIOL: Estradiol: 253 pg/mL

## 2022-06-04 ENCOUNTER — Ambulatory Visit: Payer: 59 | Admitting: Internal Medicine

## 2022-06-13 ENCOUNTER — Encounter: Payer: Self-pay | Admitting: Obstetrics & Gynecology

## 2022-06-13 ENCOUNTER — Ambulatory Visit: Payer: 59 | Admitting: Obstetrics & Gynecology

## 2022-06-13 VITALS — BP 142/90 | HR 96

## 2022-06-13 DIAGNOSIS — N912 Amenorrhea, unspecified: Secondary | ICD-10-CM | POA: Diagnosis not present

## 2022-06-13 DIAGNOSIS — N951 Menopausal and female climacteric states: Secondary | ICD-10-CM | POA: Diagnosis not present

## 2022-06-13 MED ORDER — MEDROXYPROGESTERONE ACETATE 5 MG PO TABS: 5.0000 mg | ORAL_TABLET | Freq: Every day | ORAL | 0 refills | Status: DC

## 2022-06-13 NOTE — Progress Notes (Signed)
    Wanda [name] June 02, 1980 [id number]        42 y.o.  G1P0010   RP: Amenorrhea x 01/01/22  HPI: LMP 01/01/22.  No vaginal bleeding since then.  Menses were normal every month until 12/2021.  No pelvic pain.  No hot flushes or night sweats.  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 S/P Rt Oophorectomy/Bilateral Salpingectomy. Many rounds of IVF.  Last one ended up with a Spont. Ab in the first trimester.  DM on Insulin and Goiter, Bx benign, followed by Dr Lucianne Muss.  Recent TSH normal.   OB History  Gravida Para Term Preterm AB Living  1 0 0 0 1 0  SAB IAB Ectopic Multiple Live Births  1 0 0 0 0    # Outcome Date GA Lbr Len/2nd Weight Sex Delivery Anes PTL Lv  1 SAB             Past medical history,surgical history, problem list, medications, allergies, family history and social history were all reviewed and documented in the EPIC chart.   Directed ROS with pertinent positives and negatives documented in the history of present illness/assessment and plan.  Exam:  Vitals:   06/13/22 0949 06/13/22 0952  BP: (!) 158/90 (!) 142/90  Pulse: 96   SpO2: 99%    General appearance:  Normal  Abdomen: Normal  Gynecologic exam: Vulva normal.  Bimanual exam:  Uterus AV, normal volume, mobile, NT.  No adnexal mass, NT bilaterally.  No blood or normal secretions.   Assessment/Plan:  42 y.o. G1P0010   1. Amenorrhea LMP 01/01/22.  No vaginal bleeding since then.  Menses were normal every month until 12/2021.  No pelvic pain.  No hot flushes or night sweats.  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 S/P Rt Oophorectomy/Bilateral Salpingectomy. Many rounds of IVF.  Last one ended up with a Spont. Ab in the first trimester.  DM on Insulin and Goiter, Bx benign, followed by Dr Lucianne Muss.  Recent TSH normal. Probably perimenopause, will investigate with a FSH today.  Will also attempt a withdrawal bleeding with Provera 5 mg PO daily x 10 days.  F/U in 3 weeks to discuss results.  If FSH is in  menopausal range, will recommend HRT especially given patient's young age of 51 for menopause.  - FSH  2. Perimenopause Verify FSH today.  Management as described above. - FSH  Other orders - Alcohol Swabs (SM ALCOHOL PREP) 70 % PADS; USE AS DIRECTED for 30 - glucose blood test strip; testing once a week - Lancets MISC; FSBS DAILY - medroxyPROGESTERone (PROVERA) 5 MG tablet; Take 1 tablet (5 mg total) by mouth daily for 10 days.   Genia Del MD, 10:16 AM 06/13/2022

## 2022-06-14 LAB — FOLLICLE STIMULATING HORMONE: FSH: 10.3 m[IU]/mL

## 2022-06-30 ENCOUNTER — Other Ambulatory Visit (HOSPITAL_COMMUNITY): Payer: Self-pay

## 2022-07-01 ENCOUNTER — Telehealth: Payer: Self-pay

## 2022-07-01 ENCOUNTER — Other Ambulatory Visit (HOSPITAL_COMMUNITY): Payer: Self-pay

## 2022-07-01 NOTE — Telephone Encounter (Signed)
PA request received via CMM for Ozempic (2 MG/DOSE) /3ML pen-injectors  PA has been submitted to [facility] and is pending additional questions/determination  Key: [id number]

## 2022-07-02 NOTE — Telephone Encounter (Signed)
PA has been APPROVED from 07/01/2022-07/01/2023 

## 2022-07-04 ENCOUNTER — Ambulatory Visit: Payer: 59 | Admitting: Obstetrics & Gynecology

## 2022-07-15 ENCOUNTER — Other Ambulatory Visit (HOSPITAL_COMMUNITY): Payer: Self-pay

## 2022-08-27 ENCOUNTER — Ambulatory Visit: Payer: 59 | Admitting: Registered"

## 2022-09-29 ENCOUNTER — Ambulatory Visit: Payer: 59 | Admitting: Internal Medicine

## 2022-09-29 DIAGNOSIS — E1165 Type 2 diabetes mellitus with hyperglycemia: Secondary | ICD-10-CM

## 2022-10-01 ENCOUNTER — Ambulatory Visit: Payer: 59 | Admitting: Internal Medicine

## 2022-10-01 ENCOUNTER — Other Ambulatory Visit (HOSPITAL_COMMUNITY): Payer: Self-pay

## 2022-10-01 ENCOUNTER — Encounter: Payer: Self-pay | Admitting: Internal Medicine

## 2022-10-01 VITALS — BP 130/84 | HR 98 | Temp 98.6°F | Ht 61.0 in | Wt 169.1 lb

## 2022-10-01 DIAGNOSIS — E1165 Type 2 diabetes mellitus with hyperglycemia: Secondary | ICD-10-CM | POA: Diagnosis not present

## 2022-10-01 DIAGNOSIS — G629 Polyneuropathy, unspecified: Secondary | ICD-10-CM | POA: Diagnosis not present

## 2022-10-01 DIAGNOSIS — Z794 Long term (current) use of insulin: Secondary | ICD-10-CM | POA: Diagnosis not present

## 2022-10-01 DIAGNOSIS — E785 Hyperlipidemia, unspecified: Secondary | ICD-10-CM

## 2022-10-01 DIAGNOSIS — B3731 Acute candidiasis of vulva and vagina: Secondary | ICD-10-CM

## 2022-10-01 DIAGNOSIS — E1169 Type 2 diabetes mellitus with other specified complication: Secondary | ICD-10-CM | POA: Insufficient documentation

## 2022-10-01 LAB — POCT GLYCOSYLATED HEMOGLOBIN (HGB A1C): Hemoglobin A1C: 10.6 % — AB (ref 4.0–5.6)

## 2022-10-01 MED ORDER — FLUCONAZOLE 150 MG PO TABS
150.0000 mg | ORAL_TABLET | Freq: Once | ORAL | 0 refills | Status: AC
  Filled 2022-10-01: qty 1, 1d supply, fill #0

## 2022-10-01 MED ORDER — ATORVASTATIN CALCIUM 20 MG PO TABS
20.0000 mg | ORAL_TABLET | Freq: Every day | ORAL | 1 refills | Status: DC
  Filled 2022-10-01: qty 30, 30d supply, fill #0
  Filled 2023-05-13: qty 30, 30d supply, fill #1

## 2022-10-01 NOTE — Assessment & Plan Note (Signed)
Uncontrolled with an A1c of 10.6.  We have discussed importance of lifestyle changes including healthy eating and exercise.  We have discussed importance of taking Ozempic routinely.  She is only taking it approximately every other week.  Once back on weekly Ozempic if A1c fails to decrease to goal, can consider resuming her insulins that had been prescribed by endocrinology.

## 2022-10-01 NOTE — Assessment & Plan Note (Signed)
Due to uncontrolled diabetes.  She would like to hold off on gabapentin for now.

## 2022-10-01 NOTE — Assessment & Plan Note (Signed)
Most recent lipid panel with a total cholesterol of 192, LDL 100, HDL 68 and triglycerides 116.  Goal LDL is less than 70 given her history of diabetes.  Start atorvastatin 20 mg daily and recheck lipids in 3 months.

## 2022-10-01 NOTE — Progress Notes (Signed)
New Patient Office Visit     CC/Reason for Visit: Establish care, discuss chronic and acute concerns Previous PCP: None Last Visit: Unknown  HPI: Wanda [name] is a 42 y.o. female who is coming in today for the above mentioned reasons. Past Medical History is significant for: Type 2 diabetes, hyperlipidemia not on medication, obesity.  She has only been taking Ozempic on average every other week, has not been taking basal or bolus insulin that has been prescribed by her endocrinologist.  He has been dealing with peripheral neuropathy.  She works remotely from home, she is married, has 3 children.  She does not smoke, drinks only occasionally, she has an intolerance to metformin due to stomach upset and diarrhea.  Her mother has hypertension and a stroke, her father has multiple myeloma.  Her maternal grandmother had breast cancer.   Past Medical/Surgical History: Past Medical History:  Diagnosis Date   Anemia    Hx   Anxiety    Hx - no current meds   Depression    hx - no meds currently   Diabetes mellitus    type 2   DM (diabetes mellitus), type 2 (HCC)    Goiter    recent dx 06/2015 MD is watching, no meds    Hyperlipidemia associated with type 2 diabetes mellitus (HCC)    Peripheral neuropathy     Past Surgical History:  Procedure Laterality Date   DILATION AND CURETTAGE OF UTERUS  12/2017   INNER EAR SURGERY Right    rupture eardrum   LAPAROSCOPIC UNILATERAL SALPINGECTOMY Left 08/24/2015   Procedure: LAPAROSCOPIC Left  SALPINGECTOMY with  Lysis of  Omental and Left Para Tubal Adhesions;  Surgeon: Genia Del, MD;  Location: [facility];  Service: Gynecology;  Laterality: Left;   oophrectomy     right   ROBOTIC ASSISTED LAPAROSCOPIC HYSTERECTOMY AND SALPINGECTOMY  08/24/2015   Procedure: ROBOTIC ASSISTED LAPAROSCOPIC  Left SALPINGECTOMY;  Surgeon: Genia Del, MD;  Location: [facility];  Service: Gynecology;;    Social History:  reports that she has never  smoked. She has never used smokeless tobacco. She reports that she does not currently use alcohol. She reports that she does not use drugs.  Allergies: Allergies  Allergen Reactions   Metformin And Related     diarrhea    Family History:  Family History  Problem Relation Age of Onset   Hypertension Mother    Cancer Father        multiple myeloma   Multiple myeloma Father    Hypertension Father    Cancer Paternal Aunt        colon   Breast cancer Maternal Grandmother    Breast cancer Paternal Grandmother    Multiple myeloma Paternal Grandfather      Current Outpatient Medications:    Alcohol Swabs (SM ALCOHOL PREP) 70 % PADS, USE AS DIRECTED for 30, Disp: , Rfl:    atorvastatin (LIPITOR) 20 MG tablet, Take 1 tablet (20 mg total) by mouth daily., Disp: 90 tablet, Rfl: 1   Continuous Blood Gluc Sensor (DEXCOM G7 SENSOR) MISC, 1 Device by Does not apply route as directed., Disp: 9 each, Rfl: 3   fluconazole (DIFLUCAN) 150 MG tablet, Take 1 tablet (150 mg total) by mouth once for 1 dose., Disp: 1 tablet, Rfl: 0   glucose blood (ONETOUCH VERIO) test strip, 1 each by Other route 3 (three) times daily. Use as instructed, Disp: 300 each, Rfl: 2   glucose blood test  strip, testing once a week, Disp: , Rfl:    Insulin Pen Needle 32G X 4 MM MISC, 1 Device by Does not apply route in the morning, at noon, in the evening, and at bedtime., Disp: 400 each, Rfl: 3   Lancets MISC, FSBS DAILY, Disp: , Rfl:    Semaglutide, 2 MG/DOSE, 8 MG/3ML SOPN, Inject 2 mg as directed once a week., Disp: 9 mL, Rfl: 3   insulin glargine (LANTUS SOLOSTAR) 100 UNIT/ML Solostar Pen, Inject 16 Units into the skin daily. (Patient not taking: Reported on 10/01/2022), Disp: 30 mL, Rfl: 2   insulin lispro (HUMALOG KWIKPEN) 100 UNIT/ML KwikPen, Max daily 30 units Blood sugar before meal Number of units to inject Less than 165 0 unit 166 -  200 1 units 201 -  235 2 units 236 -  270 3 units 271 -  305 4 units 306 -  340 5  units 341 -  375 6 units 376 -  410 7 units (Patient not taking: Reported on 10/01/2022), Disp: 30 mL, Rfl: 3   pioglitazone (ACTOS) 15 MG tablet, Take 1 tablet (15 mg total) by mouth daily. (Patient not taking: Reported on 10/01/2022), Disp: 90 tablet, Rfl: 3  Review of Systems:  Negative except as indicated in HPI.   Physical Exam: Vitals:   10/01/22 0731  BP: 130/84  Pulse: 98  Temp: 98.6 F (37 C)  TempSrc: Oral  SpO2: 100%  Weight: 169 lb 1.6 oz (76.7 kg)  Height: 5\' 1"  (1.549 m)   Body mass index is 31.95 kg/m.  Physical Exam Vitals reviewed.  Constitutional:      Appearance: Normal appearance.  HENT:     Head: Normocephalic and atraumatic.  Eyes:     Conjunctiva/sclera: Conjunctivae normal.     Pupils: Pupils are equal, round, and reactive to light.  Cardiovascular:     Rate and Rhythm: Normal rate and regular rhythm.  Pulmonary:     Effort: Pulmonary effort is normal.     Breath sounds: Normal breath sounds.  Skin:    General: Skin is warm and dry.  Neurological:     General: No focal deficit present.     Mental Status: She is alert and oriented to person, place, and time.  Psychiatric:        Mood and Affect: Mood normal.        Behavior: Behavior normal.        Thought Content: Thought content normal.        Judgment: Judgment normal.       Impression and Plan:  Type 2 diabetes mellitus with hyperglycemia, with long-term current use of insulin (HCC) Assessment & Plan: Uncontrolled with an A1c of 10.6.  We have discussed importance of lifestyle changes including healthy eating and exercise.  We have discussed importance of taking Ozempic routinely.  She is only taking it approximately every other week.  Once back on weekly Ozempic if A1c fails to decrease to goal, can consider resuming her insulins that had been prescribed by endocrinology.  Orders: -     POCT glycosylated hemoglobin (Hb A1C)  Hyperlipidemia associated with type 2 diabetes mellitus  (HCC) Assessment & Plan: Most recent lipid panel with a total cholesterol of 192, LDL 100, HDL 68 and triglycerides 116.  Goal LDL is less than 70 given her history of diabetes.  Start atorvastatin 20 mg daily and recheck lipids in 3 months.  Orders: -     Atorvastatin Calcium; Take 1 tablet (  20 mg total) by mouth daily.  Dispense: 90 tablet; Refill: 1  Peripheral polyneuropathy Assessment & Plan: Due to uncontrolled diabetes.  She would like to hold off on gabapentin for now.   Vaginal yeast infection -     Fluconazole; Take 1 tablet (150 mg total) by mouth once for 1 dose.  Dispense: 1 tablet; Refill: 0  -Fluconazole 150 mg x 1.   Time spent: 46 minutes reviewing chart, interviewing and examining patient, discussing importance of diabetic management and formulating plan of care.    Chaya Jan, MD [facility] at [facility]

## 2022-10-07 ENCOUNTER — Encounter: Payer: Self-pay | Admitting: Internal Medicine

## 2022-10-15 ENCOUNTER — Ambulatory Visit: Payer: 59 | Admitting: Internal Medicine

## 2022-10-16 NOTE — Telephone Encounter (Signed)
Copy ready to be picked up and the patient is aware.

## 2022-10-17 ENCOUNTER — Telehealth: Payer: Self-pay | Admitting: Internal Medicine

## 2022-10-17 NOTE — Telephone Encounter (Signed)
Pt came in to pick up FMLA paperwork. She states the her HR dept requires for physician to initial and date next to section #4 and #6 that had been updated. Paperwork was placed back in provider's red folder at front office.

## 2022-10-21 NOTE — Telephone Encounter (Signed)
Placed on Dr Hernandez's desk 

## 2022-10-22 NOTE — Telephone Encounter (Signed)
Misty Stanley with [employer] [phone]) calling to confirm that doctor filled this form out or if the patient may have made these updates. Requesting a call to confirm

## 2022-10-24 NOTE — Telephone Encounter (Signed)
Wanda [name] with [employer] is calling to confirm md made the changes to [name] paperwork

## 2022-10-27 NOTE — Telephone Encounter (Signed)
Updated form is complete and ready to be picked up.  Patient is aware.

## 2022-10-29 ENCOUNTER — Telehealth: Payer: Self-pay | Admitting: Family Medicine

## 2022-10-29 NOTE — Telephone Encounter (Signed)
Form faxed and confirmed to Lisa/Jessica [phone].

## 2022-10-29 NOTE — Telephone Encounter (Signed)
Misty Stanley calling again to get confirmation that provider filled this paperwork out and if she did her initials were needed on the form. Misty Stanley is requesting a call or the form sent back with the provider's initials.

## 2022-10-29 NOTE — Telephone Encounter (Signed)
ERROR PLEASE DISREGARD

## 2022-11-04 ENCOUNTER — Encounter: Payer: Self-pay | Admitting: Internal Medicine

## 2022-11-04 ENCOUNTER — Ambulatory Visit: Payer: 59 | Admitting: Internal Medicine

## 2022-11-04 ENCOUNTER — Other Ambulatory Visit (HOSPITAL_COMMUNITY)
Admission: RE | Admit: 2022-11-04 | Discharge: 2022-11-04 | Disposition: A | Discharge status: Home / Self Care | Payer: 59 | Source: Ambulatory Visit | Attending: Internal Medicine | Admitting: Internal Medicine

## 2022-11-04 VITALS — BP 120/84 | HR 98 | Temp 98.3°F | Wt 171.5 lb

## 2022-11-04 DIAGNOSIS — N898 Other specified noninflammatory disorders of vagina: Secondary | ICD-10-CM | POA: Diagnosis present

## 2022-11-04 NOTE — Progress Notes (Signed)
Established Patient Office Visit     CC/Reason for Visit: Continued vaginal itching  HPI: Wanda [name] is a 42 y.o. female who is coming in today for the above mentioned reasons.  She continues to complain of vaginal itching.  Towards the end of July she received fluconazole but symptoms have persisted and have gotten worse.   Past Medical/Surgical History: Past Medical History:  Diagnosis Date   Anemia    Hx   Anxiety    Hx - no current meds   Depression    hx - no meds currently   Diabetes mellitus    type 2   DM (diabetes mellitus), type 2 (HCC)    Goiter    recent dx 06/2015 MD is watching, no meds    Hyperlipidemia associated with type 2 diabetes mellitus (HCC)    Peripheral neuropathy     Past Surgical History:  Procedure Laterality Date   DILATION AND CURETTAGE OF UTERUS  12/2017   INNER EAR SURGERY Right    rupture eardrum   LAPAROSCOPIC UNILATERAL SALPINGECTOMY Left 08/24/2015   Procedure: LAPAROSCOPIC Left  SALPINGECTOMY with  Lysis of  Omental and Left Para Tubal Adhesions;  Surgeon: Genia Del, MD;  Location: [facility];  Service: Gynecology;  Laterality: Left;   oophrectomy     right   ROBOTIC ASSISTED LAPAROSCOPIC HYSTERECTOMY AND SALPINGECTOMY  08/24/2015   Procedure: ROBOTIC ASSISTED LAPAROSCOPIC  Left SALPINGECTOMY;  Surgeon: Genia Del, MD;  Location: [facility];  Service: Gynecology;;    Social History:  reports that she has never smoked. She has never used smokeless tobacco. She reports that she does not currently use alcohol. She reports that she does not use drugs.  Allergies: Allergies  Allergen Reactions   Metformin And Related     diarrhea    Family History:  Family History  Problem Relation Age of Onset   Hypertension Mother    Cancer Father        multiple myeloma   Multiple myeloma Father    Hypertension Father    Cancer Paternal Aunt        colon   Breast cancer Maternal Grandmother    Breast cancer Paternal  Grandmother    Multiple myeloma Paternal Grandfather      Current Outpatient Medications:    Alcohol Swabs (SM ALCOHOL PREP) 70 % PADS, USE AS DIRECTED for 30, Disp: , Rfl:    atorvastatin (LIPITOR) 20 MG tablet, Take 1 tablet (20 mg total) by mouth daily., Disp: 90 tablet, Rfl: 1   Continuous Blood Gluc Sensor (DEXCOM G7 SENSOR) MISC, 1 Device by Does not apply route as directed., Disp: 9 each, Rfl: 3   glucose blood (ONETOUCH VERIO) test strip, 1 each by Other route 3 (three) times daily. Use as instructed, Disp: 300 each, Rfl: 2   glucose blood test strip, testing once a week, Disp: , Rfl:    insulin glargine (LANTUS SOLOSTAR) 100 UNIT/ML Solostar Pen, Inject 16 Units into the skin daily., Disp: 30 mL, Rfl: 2   insulin lispro (HUMALOG KWIKPEN) 100 UNIT/ML KwikPen, Max daily 30 units Blood sugar before meal Number of units to inject Less than 165 0 unit 166 -  200 1 units 201 -  235 2 units 236 -  270 3 units 271 -  305 4 units 306 -  340 5 units 341 -  375 6 units 376 -  410 7 units, Disp: 30 mL, Rfl: 3   Insulin Pen  Needle 32G X 4 MM MISC, 1 Device by Does not apply route in the morning, at noon, in the evening, and at bedtime., Disp: 400 each, Rfl: 3   Lancets MISC, FSBS DAILY, Disp: , Rfl:    pioglitazone (ACTOS) 15 MG tablet, Take 1 tablet (15 mg total) by mouth daily., Disp: 90 tablet, Rfl: 3   Semaglutide, 2 MG/DOSE, 8 MG/3ML SOPN, Inject 2 mg as directed once a week., Disp: 9 mL, Rfl: 3  Review of Systems:  Negative unless indicated in HPI.   Physical Exam: Vitals:   11/04/22 1604  BP: 120/84  Pulse: 98  Temp: 98.3 F (36.8 C)  TempSrc: Oral  SpO2: 100%  Weight: 171 lb 8 oz (77.8 kg)    Body mass index is 32.4 kg/m.   Physical Exam Exam conducted with a chaperone present.  Genitourinary:    Vagina: Vaginal discharge present.     Cervix: Normal.     Comments: Grayish vaginal discharge is present.     Impression and Plan:  Vaginal itching -     Cervicovaginal  ancillary only   -Speculum exam and wet prep done in office today.  Discharge is not classic of yeast.  Further treatment to follow results.  Time spent:23 minutes reviewing chart, interviewing and examining patient and formulating plan of care.     Chaya Jan, MD [facility] at [facility]

## 2022-11-12 ENCOUNTER — Telehealth: Payer: Self-pay | Admitting: Internal Medicine

## 2022-11-12 NOTE — Telephone Encounter (Signed)
Pt called to say she saw her results via Mychart, and would like to know if MD has reviewed them yet, so that she can prescribe an appropriate Rx ?

## 2022-11-13 ENCOUNTER — Other Ambulatory Visit (HOSPITAL_COMMUNITY): Payer: Self-pay

## 2022-11-13 LAB — CERVICOVAGINAL ANCILLARY ONLY
Bacterial Vaginitis (gardnerella): POSITIVE — AB
Candida Glabrata: NEGATIVE
Candida Vaginitis: POSITIVE — AB
Chlamydia: NEGATIVE
Comment: NEGATIVE
Comment: NEGATIVE
Comment: NEGATIVE
Comment: NEGATIVE
Comment: NEGATIVE
Comment: NORMAL
Neisseria Gonorrhea: NEGATIVE
Trichomonas: NEGATIVE

## 2022-11-13 MED ORDER — FLUCONAZOLE 100 MG PO TABS
100.0000 mg | ORAL_TABLET | Freq: Every day | ORAL | 0 refills | Status: DC
  Filled 2022-11-13: qty 1, 1d supply, fill #0

## 2022-11-13 MED ORDER — METRONIDAZOLE 500 MG PO TABS
ORAL_TABLET | ORAL | 0 refills | Status: DC
  Filled 2022-11-13: qty 4, 1d supply, fill #0

## 2022-11-13 NOTE — Telephone Encounter (Signed)
Refill sent.

## 2022-11-14 ENCOUNTER — Telehealth: Payer: Self-pay | Admitting: Internal Medicine

## 2022-11-14 NOTE — Telephone Encounter (Signed)
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  They are asking that we please re-fax at our earliest convenience.   Fax:  [phone]

## 2022-11-17 NOTE — Telephone Encounter (Signed)
Forms were refaxed and confirmed

## 2022-11-24 ENCOUNTER — Other Ambulatory Visit (HOSPITAL_COMMUNITY): Payer: Self-pay

## 2022-11-24 MED ORDER — METHOCARBAMOL 500 MG PO TABS
500.0000 mg | ORAL_TABLET | Freq: Two times a day (BID) | ORAL | 0 refills | Status: AC
  Filled 2022-11-24: qty 28, 14d supply, fill #0

## 2022-11-24 MED ORDER — GABAPENTIN 100 MG PO CAPS
100.0000 mg | ORAL_CAPSULE | Freq: Every evening | ORAL | 0 refills | Status: AC
  Filled 2022-11-24: qty 14, 14d supply, fill #0

## 2022-11-24 MED ORDER — MELOXICAM 15 MG PO TABS
15.0000 mg | ORAL_TABLET | Freq: Every day | ORAL | 0 refills | Status: AC
  Filled 2022-11-24: qty 14, 14d supply, fill #0

## 2022-12-30 ENCOUNTER — Telehealth: Payer: 59 | Admitting: Internal Medicine

## 2022-12-30 ENCOUNTER — Other Ambulatory Visit: Payer: Self-pay | Admitting: *Deleted

## 2022-12-30 MED ORDER — SCOPOLAMINE 1 MG/3DAYS TD PT72
1.0000 | MEDICATED_PATCH | TRANSDERMAL | 12 refills | Status: AC
  Filled 2022-12-30: qty 10, 30d supply, fill #0
  Filled 2023-05-13 – 2023-11-16 (×2): qty 10, 30d supply, fill #1

## 2022-12-31 ENCOUNTER — Other Ambulatory Visit (HOSPITAL_COMMUNITY): Payer: Self-pay

## 2023-01-15 ENCOUNTER — Encounter: Payer: 59 | Admitting: Internal Medicine

## 2023-03-02 ENCOUNTER — Ambulatory Visit: Payer: 59 | Admitting: Internal Medicine

## 2023-05-11 NOTE — Progress Notes (Unsigned)
 Name: Wanda [name]  MRN/ DOB: [id number], September 18, 1980   Age/ Sex: 43 y.o., female    PCP: Philip Aspen, Limmie Patricia, MD   Reason for Endocrinology Evaluation: Type 2 Diabetes Mellitus     Date of Initial Endocrinology Visit: 12/21/2019    PATIENT IDENTIFIER: Wanda [name] is a 43 y.o. female with a past medical history of T2DM , dyslipidemia and MNG. The patient presented for initial endocrinology clinic visit on 12/21/2019 for consultative assistance with her diabetes management.    HPI: Ms. Bolding was    Diagnosed with DM in 2012 Prior Medications tried/Intolerance: Invokana- does not recall any side effects, Trulicity- nausea  , Metformin - GI side effects            Hemoglobin A1c has ranged from 11.8%in 2016, peaking at 12.7% in 2021.  Started pioglitazone 05/2022 with an A1c of 10.4%  She was referred to our CDE for CGM training 2024  THYROID HISTORY: She has been diagnosed with MNG in 2017. She is S/P benign FNA of the right superior and right inferior nodules.  She denies any local worsening of enlargement   SUBJECTIVE:   During the last visit (05/26/2022): A1c 10.4 %     Today (05/12/23): Wanda [name] is here for follow-up on diabetes management.  She has NOT been to our clinic in 12 months. She has not been checking glucose at home    Stable local neck swelling   Denies depression  Takes humalog 3 days out of the week  Takes lantus 3-4x a week    Denies vomiting but has occasional nausea with Ozempic  Denies diarrhea but has constipation  She has been having vaginal irritation   HOME DIABETES REGIMEN: Pioglitazone 15 mg daily  Ozempic 2 mg weekly Lantus 16 units daily  Humalog (BG-130/35) TIDQAC     Statin: yes ACE-I/ARB: no Prior Diabetic Education: yes   METER DOWNLOAD SUMMARY: Did not bring     DIABETIC COMPLICATIONS: Microvascular complications:  Neuropathy Denies: CKD  Last eye exam: Completed  02/2022   Macrovascular complications:   Denies: CAD, PVD, CVA   PAST HISTORY: Past Medical History:  Past Medical History:  Diagnosis Date   Anemia    Hx   Anxiety    Hx - no current meds   Depression    hx - no meds currently   Diabetes mellitus    type 2   DM (diabetes mellitus), type 2 (HCC)    Goiter    recent dx 06/2015 MD is watching, no meds    Hyperlipidemia associated with type 2 diabetes mellitus (HCC)    Peripheral neuropathy    Past Surgical History:  Past Surgical History:  Procedure Laterality Date   DILATION AND CURETTAGE OF UTERUS  12/2017   INNER EAR SURGERY Right    rupture eardrum   LAPAROSCOPIC UNILATERAL SALPINGECTOMY Left 08/24/2015   Procedure: LAPAROSCOPIC Left  SALPINGECTOMY with  Lysis of  Omental and Left Para Tubal Adhesions;  Surgeon: Genia Del, MD;  Location: [facility];  Service: Gynecology;  Laterality: Left;   oophrectomy     right   ROBOTIC ASSISTED LAPAROSCOPIC HYSTERECTOMY AND SALPINGECTOMY  08/24/2015   Procedure: ROBOTIC ASSISTED LAPAROSCOPIC  Left SALPINGECTOMY;  Surgeon: Genia Del, MD;  Location: [facility];  Service: Gynecology;;    Social History:  reports that she has never smoked. She has never used smokeless tobacco. She reports that she does not currently use alcohol. She reports that she  does not use drugs.    Family History:  Family History  Problem Relation Age of Onset   Hypertension Mother    Cancer Father        multiple myeloma   Multiple myeloma Father    Hypertension Father    Cancer Paternal Aunt        colon   Breast cancer Maternal Grandmother    Breast cancer Paternal Grandmother    Multiple myeloma Paternal Grandfather      HOME MEDICATIONS: Allergies as of 05/12/2023       Reactions   Metformin And Related    diarrhea        Medication List        Accurate as of May 12, 2023  8:17 AM. If you have any questions, ask your nurse or doctor.          atorvastatin 20 MG  tablet Commonly known as: LIPITOR Take 1 tablet (20 mg total) by mouth daily.   Dexcom G7 Sensor Misc 1 Device by Does not apply route as directed.   fluconazole 100 MG tablet Commonly known as: Diflucan Take 1 tablet (100 mg total) by mouth once as directed   gabapentin 100 MG capsule Commonly known as: NEURONTIN Take 1 capsule (100 mg total) by mouth at bedtime for 14 days.   insulin lispro 100 UNIT/ML KwikPen Commonly known as: HumaLOG KwikPen Max daily 30 units Blood sugar before meal Number of units to inject Less than 165 0 unit 166 -  200 1 units 201 -  235 2 units 236 -  270 3 units 271 -  305 4 units 306 -  340 5 units 341 -  375 6 units 376 -  410 7 units   Insulin Pen Needle 32G X 4 MM Misc 1 Device by Does not apply route in the morning, at noon, in the evening, and at bedtime.   Lancets Misc FSBS DAILY   Lantus SoloStar 100 UNIT/ML Solostar Pen Generic drug: insulin glargine Inject 16 Units into the skin daily.   meloxicam 15 MG tablet Commonly known as: MOBIC Take 1 tablet (15 mg total) by mouth daily.   methocarbamol 500 MG tablet Commonly known as: ROBAXIN Take 1 tablet (500 mg total) by mouth 2 (two) times daily for 14 days.   metroNIDAZOLE 500 MG tablet Commonly known as: FLAGYL Take 4 tablets by mouth all at once (no alcohol)   glucose blood test strip testing once a week   OneTouch Verio test strip Generic drug: glucose blood 1 each by Other route 3 (three) times daily. Use as instructed   pioglitazone 15 MG tablet Commonly known as: Actos Take 1 tablet (15 mg total) by mouth daily.   scopolamine 1 MG/3DAYS Commonly known as: TRANSDERM-SCOP Place 1 patch (1.5 mg total) onto the skin every 3 (three) days.   Semaglutide (2 MG/DOSE) 8 MG/3ML Sopn Inject 2 mg as directed once a week.   SM Alcohol Prep 70 % Pads USE AS DIRECTED for 30         ALLERGIES: Allergies  Allergen Reactions   Metformin And Related     diarrhea          OBJECTIVE:   VITAL SIGNS: BP 126/80 (BP Location: Left Arm, Patient Position: Sitting, Cuff Size: Normal)   Ht 5\' 1"  (1.549 m)   Wt 171 lb (77.6 kg)   BMI 32.31 kg/m    PHYSICAL EXAM:  General: Pt appears well and is in  NAD  Neck: General: Supple without adenopathy or carotid bruits. Thyroid: Thyroid size enlarged ~ 80 grams .  Nodules appreciate  Lungs: Clear with good BS bilat with no rales, rhonchi, or wheezes  Heart: RRR   Abdomen: Soft, nontender  Extremities:  Lower extremities - No pretibial edema  Neuro: MS is good with appropriate affect, pt is alert and Ox3   DM Foot Exam 05/12/2023   The skin of the feet is intact without sores or ulcerations. The pedal pulses are 2+ on right and 2+ on left. The sensation is intact to a screening 5.07, 10 gram monofilament bilaterally    DATA REVIEWED:  Lab Results  Component Value Date   HGBA1C 10.1 (A) 05/26/2022   HGBA1C 10.4 (A) 12/03/2021   HGBA1C 13.1 (A) 07/25/2021     FNA 07/04/2019 THYROID, FINE NEEDLE ASPIRATION, RIGHT SUPERIOR NODULE (SPECIMEN 1 OF 2, COLLECTED ON 07/04/15): CONSISTENT WITH BENIGN FOLLICULAR NODULE (BETHESDA CATEGORY II).     FNA 07/04/2015 THYROID, FINE NEEDLE ASPIRATION, RIGHT INFERIOR (SPECIMEN 2 OF 2, COLLECTED ON 07/04/15): CONSISTENT WITH BENIGN FOLLICULAR NODULE (BETHESDA CATEGORY II).  Office BG 308 mg/dL   ASSESSMENT / PLAN / RECOMMENDATIONS:   1) Type 2 Diabetes Mellitus, Poorly controlled, With Neuropathic  complications - Most recent A1c of 11.2 %. Goal A1c < 7.0 %.      -Poorly controlled diabetes due to medication nonadherence and dietary indiscretions -We again discussed complications of uncontrolled DM to include increased risk of ESRD, blindness, and amputations -Accu-Chek guide meter and extra strips were sent to the pharmacy -We briefly discussed insulin pump technology, but she would prefer to remain on multiple daily injections of insulin   MEDICATIONS: Start  Pioglitazone 15 mg daily  Take  Lantus 16 units daily Continue Ozempic 2 mg weekly  Take Humalog 4 units with each meal   EDUCATION / INSTRUCTIONS: BG monitoring instructions: Patient is instructed to check her blood sugars 2 times a day,before breakfast and supper when possible . Call [facility] if: BG persistently < 70  I reviewed the Rule of 15 for the treatment of hypoglycemia in detail with the patient. Literature supplied.   2) Diabetic complications:  Eye: Does not have known diabetic retinopathy.  Neuro/ Feet: Does  have known diabetic peripheral neuropathy. Renal: Patient does not have known baseline CKD. She is not on an ACEI/ARB at present   3) MNG:   - Pt is clinically euthyroid  - No local neck symptoms  - S/P benign FNA of the right superior and inferior nodules in 2017 -I have ordered thyroid ultrasound multiple times, but she did not feels to schedule an appointment, another order was placed today    F/U in 3 months       Signed electronically by: Lyndle Herrlich, MD  [facility]  [facility] [address] [place] [place], [place] [address] Phone: [phone] FAX: [phone]   CC: Philip Aspen, Limmie Patricia, MD [address] [place] [address] Phone: [phone]  Fax: [phone]    Return to Endocrinology clinic as below: No future appointments.

## 2023-05-12 ENCOUNTER — Encounter: Payer: Self-pay | Admitting: Internal Medicine

## 2023-05-12 ENCOUNTER — Other Ambulatory Visit: Payer: Self-pay

## 2023-05-12 ENCOUNTER — Other Ambulatory Visit (HOSPITAL_COMMUNITY): Payer: Self-pay

## 2023-05-12 ENCOUNTER — Ambulatory Visit: Payer: 59 | Admitting: Internal Medicine

## 2023-05-12 ENCOUNTER — Other Ambulatory Visit: Payer: Self-pay | Admitting: Internal Medicine

## 2023-05-12 VITALS — BP 126/80 | Ht 61.0 in | Wt 171.0 lb

## 2023-05-12 DIAGNOSIS — E042 Nontoxic multinodular goiter: Secondary | ICD-10-CM | POA: Diagnosis not present

## 2023-05-12 DIAGNOSIS — E1142 Type 2 diabetes mellitus with diabetic polyneuropathy: Secondary | ICD-10-CM

## 2023-05-12 DIAGNOSIS — Z794 Long term (current) use of insulin: Secondary | ICD-10-CM | POA: Diagnosis not present

## 2023-05-12 DIAGNOSIS — E1165 Type 2 diabetes mellitus with hyperglycemia: Secondary | ICD-10-CM

## 2023-05-12 LAB — POCT GLYCOSYLATED HEMOGLOBIN (HGB A1C): Hemoglobin A1C: 11.2 % — AB (ref 4.0–5.6)

## 2023-05-12 LAB — POCT GLUCOSE (DEVICE FOR HOME USE): Glucose Fasting, POC: 308 mg/dL — AB (ref 70–99)

## 2023-05-12 MED ORDER — INSULIN PEN NEEDLE 32G X 4 MM MISC
1.0000 | Freq: Four times a day (QID) | 3 refills | Status: DC
  Filled 2023-05-12: qty 100, 25d supply, fill #0

## 2023-05-12 MED ORDER — LANTUS SOLOSTAR 100 UNIT/ML ~~LOC~~ SOPN
16.0000 [IU] | PEN_INJECTOR | Freq: Every day | SUBCUTANEOUS | 3 refills | Status: DC
  Filled 2023-05-12: qty 6, 31d supply, fill #0

## 2023-05-12 MED ORDER — CONTOUR NEXT MONITOR W/DEVICE KIT
1.0000 | PACK | Freq: Four times a day (QID) | 0 refills | Status: AC
Start: 2023-05-12 — End: ?
  Filled 2023-05-12 (×2): qty 1, 30d supply, fill #0

## 2023-05-12 MED ORDER — SEMAGLUTIDE (2 MG/DOSE) 8 MG/3ML ~~LOC~~ SOPN
2.0000 mg | PEN_INJECTOR | SUBCUTANEOUS | 3 refills | Status: DC
  Filled 2023-05-12: qty 3, 28d supply, fill #0

## 2023-05-12 MED ORDER — PIOGLITAZONE HCL 15 MG PO TABS
15.0000 mg | ORAL_TABLET | Freq: Every day | ORAL | 3 refills | Status: AC
Start: 2023-05-12 — End: ?
  Filled 2023-05-12: qty 30, 30d supply, fill #0

## 2023-05-12 MED ORDER — INSULIN LISPRO (1 UNIT DIAL) 100 UNIT/ML (KWIKPEN)
4.0000 [IU] | PEN_INJECTOR | Freq: Three times a day (TID) | SUBCUTANEOUS | 2 refills | Status: DC
  Filled 2023-05-12: qty 15, 50d supply, fill #0

## 2023-05-12 MED ORDER — MICROLET LANCETS MISC
Freq: Four times a day (QID) | 11 refills | Status: AC
  Filled 2023-05-12: qty 100, 25d supply, fill #0

## 2023-05-12 MED ORDER — CONTOUR NEXT TEST VI STRP
1.0000 | ORAL_STRIP | Freq: Four times a day (QID) | 3 refills | Status: AC
  Filled 2023-05-12: qty 100, 25d supply, fill #0

## 2023-05-12 NOTE — Patient Instructions (Signed)
-   Start Pioglitazone (Acots) 15 mg, 1 tablet every morning  - Take Lantus 16 units daily  - Continue  Ozempic 2 mg weekly  -Humalog 4 units with Lunch and Supper   HOW TO TREAT LOW BLOOD SUGARS (Blood sugar LESS THAN 70 MG/DL) Please follow the RULE OF 15 for the treatment of hypoglycemia treatment (when your (blood sugars are less than 70 mg/dL)   STEP 1: Take 15 grams of carbohydrates when your blood sugar is low, which includes:  3-4 GLUCOSE TABS  OR 3-4 OZ OF JUICE OR REGULAR SODA OR ONE TUBE OF GLUCOSE GEL    STEP 2: RECHECK blood sugar in 15 MINUTES STEP 3: If your blood sugar is still low at the 15 minute recheck --> then, go back to STEP 1 and treat AGAIN with another 15 grams of carbohydrates.

## 2023-05-13 ENCOUNTER — Other Ambulatory Visit: Payer: Self-pay | Admitting: Internal Medicine

## 2023-05-13 ENCOUNTER — Other Ambulatory Visit (HOSPITAL_COMMUNITY): Payer: Self-pay

## 2023-05-13 ENCOUNTER — Other Ambulatory Visit: Payer: Self-pay

## 2023-05-20 ENCOUNTER — Other Ambulatory Visit (HOSPITAL_COMMUNITY): Payer: Self-pay

## 2023-05-25 ENCOUNTER — Ambulatory Visit
Admission: RE | Admit: 2023-05-25 | Discharge: 2023-05-25 | Disposition: A | Discharge status: Home / Self Care | Source: Ambulatory Visit | Attending: Internal Medicine | Admitting: Internal Medicine

## 2023-05-25 ENCOUNTER — Other Ambulatory Visit (HOSPITAL_COMMUNITY): Payer: Self-pay

## 2023-05-25 DIAGNOSIS — E042 Nontoxic multinodular goiter: Secondary | ICD-10-CM

## 2023-05-26 ENCOUNTER — Telehealth: Payer: Self-pay | Admitting: Internal Medicine

## 2023-05-26 DIAGNOSIS — E042 Nontoxic multinodular goiter: Secondary | ICD-10-CM

## 2023-05-26 NOTE — Telephone Encounter (Signed)
 Spoke to the pt on 05/26/2023 at 1:15 pm  to discuss thyroid ultrasound results.   Thyroid ultrasound  Nodule 1: 4.8 x 4.1 x 3.3 cm right superior thyroid nodule is not significantly changed in size since prior examination where it measured 5.1 x 3.9 x 3.0 cm. Please correlate with prior FNA results.   Nodule 2: Previously biopsied right inferior thyroid nodule has increased in size now measuring 5.1 x 4.9 x 3.5 cm compared to 4.0 x 2.6 x 3.4 cm. Please correlate with prior FNA results   Nodule 3: 0.9 x 0.5 x 0.6 cm mixed solid cystic isoechoic left mid thyroid nodule (TI-RADS 2) does not meet criteria for imaging surveillance or FNA.   IMPRESSION: 1. Previously biopsied 4.8 x 4.1 x 3.3 cm right superior thyroid nodule does not demonstrate significant interval growth. If prior results were benign, no further follow-up or intervention is needed. 2. Previously biopsied right inferior thyroid nodule demonstrates interval growth in size now measuring 5.1 x 4.9 x 3.5 cm compared to 4.0 x 2.6 x 3.4 cm. Repeat ultrasound guided FNA should be considered.    I have recommended proceeding with a repeat FNA of the nodule #2, right inferior nodule, patient in agreement, an order has been placed

## 2023-06-02 ENCOUNTER — Other Ambulatory Visit (HOSPITAL_COMMUNITY)
Admission: RE | Admit: 2023-06-02 | Discharge: 2023-06-02 | Disposition: A | Discharge status: Home / Self Care | Source: Ambulatory Visit | Attending: Interventional Radiology | Admitting: Interventional Radiology

## 2023-06-02 ENCOUNTER — Ambulatory Visit
Admission: RE | Admit: 2023-06-02 | Discharge: 2023-06-02 | Disposition: A | Discharge status: Home / Self Care | Source: Ambulatory Visit | Attending: Internal Medicine | Admitting: Internal Medicine

## 2023-06-02 ENCOUNTER — Telehealth: Payer: Self-pay

## 2023-06-02 DIAGNOSIS — E042 Nontoxic multinodular goiter: Secondary | ICD-10-CM | POA: Insufficient documentation

## 2023-06-02 NOTE — Telephone Encounter (Signed)
 Pharmacy Patient Advocate Encounter   Received notification from CoverMyMeds that prior authorization for  Ozempic (2 MG/DOSE) 8MG /3ML pen-injectors is required/requested.   Insurance verification completed.   The patient is insured through [facility] .   Per test claim: PA required; PA submitted to above mentioned insurance via CoverMyMeds Key/confirmation #/EOC [facility] Status is pending

## 2023-06-04 LAB — CYTOLOGY - NON PAP

## 2023-06-05 ENCOUNTER — Telehealth: Payer: Self-pay | Admitting: Internal Medicine

## 2023-06-05 NOTE — Telephone Encounter (Signed)
 Pharmacy Patient Advocate Encounter  Received notification from [facility] that Prior Authorization for Ozempic has been APPROVED through 06/01/24   PA #/Case ID/Reference #: [id number]

## 2023-06-05 NOTE — Telephone Encounter (Signed)
 Discussed right inferior FNA results with the patient on 06/05/2023 at 12:50 PM  FNA 06/02/2023   Clinical History: Previously biopsied right inferir thyroid nodule has  increased in size now measuring 5.1 x 4.9 x 3.5cm compared to 4.0 x 2.6  x 3.4cm, Please correlate with prior FNA results, Repeat ultrasound  guided FNA should be considered  Specimen Submitted:  A. THYROID, RT INFERIOR, FINE NEEDLE ASPIRATION    FINAL MICROSCOPIC DIAGNOSIS:  - Scant follicular epithelium present (Bethesda category I)    I have recommended a repeat FNA in 3 months.  Patient is concerned regarding her co-pay, she has not received explanation of benefit yet.   We opted to discuss this on her next visit in June and by then she should have a better idea regarding co-pay    Wanda Raelyn Mora, MD  [facility]  [facility] [address] [place], [place] [address] Phone: [phone] FAX: [phone]

## 2023-08-20 ENCOUNTER — Encounter: Payer: Self-pay | Admitting: Internal Medicine

## 2023-08-20 ENCOUNTER — Telehealth: Payer: Self-pay | Admitting: Internal Medicine

## 2023-08-20 NOTE — Telephone Encounter (Signed)
 Left message on machine for patient that I will place the orders as soon as I get back to the office.

## 2023-08-20 NOTE — Telephone Encounter (Signed)
 Offered Pt available appts but has conflicts with work schedule.Will call back when her schedule opens up and is more flexible.

## 2023-08-24 ENCOUNTER — Ambulatory Visit: Admitting: Internal Medicine

## 2023-08-27 ENCOUNTER — Ambulatory Visit: Admitting: Family Medicine

## 2023-08-27 ENCOUNTER — Encounter: Payer: Self-pay | Admitting: Family Medicine

## 2023-08-27 ENCOUNTER — Other Ambulatory Visit (HOSPITAL_COMMUNITY)
Admission: RE | Admit: 2023-08-27 | Discharge: 2023-08-27 | Disposition: A | Discharge status: Home / Self Care | Source: Ambulatory Visit | Attending: Family Medicine | Admitting: Family Medicine

## 2023-08-27 VITALS — BP 132/78 | HR 97 | Temp 98.3°F | Ht 61.0 in | Wt 171.8 lb

## 2023-08-27 DIAGNOSIS — M25512 Pain in left shoulder: Secondary | ICD-10-CM | POA: Diagnosis not present

## 2023-08-27 DIAGNOSIS — R300 Dysuria: Secondary | ICD-10-CM

## 2023-08-27 DIAGNOSIS — N898 Other specified noninflammatory disorders of vagina: Secondary | ICD-10-CM | POA: Diagnosis present

## 2023-08-27 DIAGNOSIS — G8929 Other chronic pain: Secondary | ICD-10-CM

## 2023-08-27 DIAGNOSIS — E13649 Other specified diabetes mellitus with hypoglycemia without coma: Secondary | ICD-10-CM | POA: Diagnosis not present

## 2023-08-27 DIAGNOSIS — N939 Abnormal uterine and vaginal bleeding, unspecified: Secondary | ICD-10-CM

## 2023-08-27 DIAGNOSIS — Z91199 Patient's noncompliance with other medical treatment and regimen due to unspecified reason: Secondary | ICD-10-CM

## 2023-08-27 LAB — POCT URINALYSIS DIPSTICK
Bilirubin, UA: NEGATIVE
Blood, UA: POSITIVE
Glucose, UA: POSITIVE — AB
Ketones, UA: POSITIVE
Leukocytes, UA: NEGATIVE
Nitrite, UA: NEGATIVE
Protein, UA: NEGATIVE
Spec Grav, UA: 1.015 (ref 1.010–1.025)
Urobilinogen, UA: 0.2 U/dL
pH, UA: 5.5 (ref 5.0–8.0)

## 2023-08-27 LAB — HM MAMMOGRAPHY

## 2023-08-27 NOTE — Progress Notes (Signed)
 Established Patient Office Visit   Subjective  Patient ID: Wanda [name], female    DOB: 20-Oct-1980  Age: 43 y.o. MRN: [id number]  Chief Complaint  Patient presents with   Medical Management of Chronic Issues    Yeast, and BV     Patient is a 43 year old female followed by Dr. Ival Marines and seen for several ongoing concerns.  Patient endorses left shoulder pain x 6 months or more.  Previously seen at [facility].  Pt states she was told there was no fracture in her shoulder but was not given a reason for shoulder pain.  States they may want to put her on strong medications.  Was advised to start PT but declined as she feels like something else could be going on.  Concerned symptoms due to blood clot or stroke.  Patient denies weakness, edema, injury.  Numbness/tingling with anterior lateral movements of LUE.  Pt concern for possible vaginitis.  Endorses history of yeast infections.  Tried medication unsure if med was for yeast or BV but states symptoms continued after treatment.  Also has dysuria and frequency.  Not checking blood sugar.  States ready to make changes to diet as having increased neuropathy.  Followed by endocrinology.  Patient also mentions menses x 1 month.  Now with spotting.  Denies dizziness.  Patient assumed incorrectly that she could transfer care with this appointment.    Patient Active Problem List   Diagnosis Date Noted   Hyperlipidemia associated with type 2 diabetes mellitus (HCC)    Peripheral neuropathy    Type 2 diabetes mellitus with hyperglycemia, with long-term current use of insulin  (HCC) 12/21/2019   Multinodular goiter 12/21/2019   Type 2 diabetes mellitus with diabetic polyneuropathy, with long-term current use of insulin  (HCC) 12/21/2019   Dyslipidemia 12/21/2019   DUB (dysfunctional uterine bleeding) 09/07/2010   Ovarian cyst 09/07/2010   Fibroid 09/07/2010   Obesity 09/07/2010   Hydrosalpinx 09/07/2010   DM (diabetes mellitus), type 2  (HCC) 09/07/2010   Past Medical History:  Diagnosis Date   Anemia    Hx   Anxiety    Hx - no current meds   Depression    hx - no meds currently   Diabetes mellitus    type 2   DM (diabetes mellitus), type 2 (HCC)    Goiter    recent dx 06/2015 MD is watching, no meds    Hyperlipidemia associated with type 2 diabetes mellitus (HCC)    Peripheral neuropathy    Past Surgical History:  Procedure Laterality Date   CESAREAN SECTION  02/2009   DILATION AND CURETTAGE OF UTERUS  12/2017   INNER EAR SURGERY Right    rupture eardrum   LAPAROSCOPIC UNILATERAL SALPINGECTOMY Left 08/24/2015   Procedure: LAPAROSCOPIC Left  SALPINGECTOMY with  Lysis of  Omental and Left Para Tubal Adhesions;  Surgeon: Marie-Lyne Lavoie, MD;  Location: [facility];  Service: Gynecology;  Laterality: Left;   oophrectomy     right   ROBOTIC ASSISTED LAPAROSCOPIC HYSTERECTOMY AND SALPINGECTOMY  08/24/2015   Procedure: ROBOTIC ASSISTED LAPAROSCOPIC  Left SALPINGECTOMY;  Surgeon: Percy Bracken, MD;  Location: [facility];  Service: Gynecology;;   Social History   Tobacco Use   Smoking status: Never   Smokeless tobacco: Never  Vaping Use   Vaping status: Never Used  Substance Use Topics   Alcohol use: Not Currently    Comment: occ   Drug use: No   Family History  Problem Relation Age of Onset  Hypertension Mother    Stroke Mother    Cancer Father        multiple myeloma   Multiple myeloma Father    Hypertension Father    Cancer Paternal Aunt        colon   Breast cancer Maternal Grandmother    Cancer Maternal Grandmother    Breast cancer Paternal Grandmother    Multiple myeloma Paternal Grandfather    Cancer Paternal Grandfather    Cancer Paternal Uncle    Allergies  Allergen Reactions   Metformin  And Related     diarrhea    ROS Negative unless stated above    Objective:     BP 132/78 (BP Location: Left Arm, Patient Position: Sitting, Cuff Size: Normal)   Pulse 97   Temp 98.3 F (36.8  C) (Oral)   Ht 5' 1 (1.549 m)   Wt 171 lb 12.8 oz (77.9 kg)   LMP 07/14/2023 (Approximate)   SpO2 97%   BMI 32.46 kg/m  BP Readings from Last 3 Encounters:  08/27/23 132/78  05/12/23 126/80  11/04/22 120/84   Wt Readings from Last 3 Encounters:  08/27/23 171 lb 12.8 oz (77.9 kg)  05/12/23 171 lb (77.6 kg)  11/04/22 171 lb 8 oz (77.8 kg)      Physical Exam Constitutional:      General: She is not in acute distress.    Appearance: Normal appearance.  HENT:     Head: Normocephalic and atraumatic.     Nose: Nose normal.     Mouth/Throat:     Mouth: Mucous membranes are moist.   Cardiovascular:     Rate and Rhythm: Normal rate and regular rhythm.     Heart sounds: Normal heart sounds. No murmur heard.    No gallop.  Pulmonary:     Effort: Pulmonary effort is normal. No respiratory distress.     Breath sounds: Normal breath sounds. No wheezing, rhonchi or rales.  Abdominal:     Palpations: Abdomen is soft.     Tenderness: There is no abdominal tenderness. There is no right CVA tenderness, left CVA tenderness, guarding or rebound.  Genitourinary:    Comments: Aptima self swab collected.  Musculoskeletal:       Arms:     Comments: No TTP of cervical, thoracic, lumbar spine.  Mild TTP of left superior scapula.  FROM of bilateral UEs.  No weakness or deformity.   Skin:    General: Skin is warm and dry.   Neurological:     Mental Status: She is alert and oriented to person, place, and time.        11/04/2022    4:54 PM 10/01/2022    8:34 AM 07/08/2016    5:37 PM  Depression screen PHQ 2/9  Decreased Interest 2 1 0  Down, Depressed, Hopeless 2 0 0  PHQ - 2 Score 4 1 0  Altered sleeping 3 3   Tired, decreased energy 2 3   Change in appetite 2 3   Feeling bad or failure about yourself  2 0   Trouble concentrating 3 3   Moving slowly or fidgety/restless 1 0   Suicidal thoughts 0 0   PHQ-9 Score 17 13   Difficult doing work/chores Very difficult Somewhat  difficult       10/01/2022    8:35 AM  GAD 7 : Generalized Anxiety Score  Nervous, Anxious, on Edge 3  Control/stop worrying 3  Worry too much - different things 3  Trouble relaxing 3  Restless 1  Easily annoyed or irritable 3  Afraid - awful might happen 3  Total GAD 7 Score 19  Anxiety Difficulty Somewhat difficult     Results for orders placed or performed in visit on 08/27/23  POCT urinalysis dipstick  Result Value Ref Range   Color, UA yellow    Clarity, UA clear    Glucose, UA Positive (A) Negative   Bilirubin, UA neg    Ketones, UA pos    Spec Grav, UA 1.015 1.010 - 1.025   Blood, UA pos    pH, UA 5.5 5.0 - 8.0   Protein, UA Negative Negative   Urobilinogen, UA 0.2 0.2 or 1.0 E.U./dL   Nitrite, UA neg    Leukocytes, UA Negative Negative   Appearance     Odor        Assessment & Plan:   Vaginal irritation -     Cervicovaginal ancillary only -     POCT urinalysis dipstick  Dysuria -     Cervicovaginal ancillary only -     POCT urinalysis dipstick  Uncontrolled diabetes mellitus of other type with hypoglycemia, unspecified hypoglycemia coma status (HCC)  Chronic left shoulder pain  Abnormal uterine bleeding (AUB)  Pt with numerous advised to follow-up with PCP to further evaluate.  Acute vaginal irritation likely 2/2 uncontrolled DM.  Aptima self swab collected.  Further recommendations if needed based on results.  POC UA negative for UTI.  Blood and glucose present.  Blood likely 2/2 chronic AUB per chart review.  History of fibroid noted.  Discussed the need for glycemic control.  Chronic left shoulder pain previously seen by Ortho.  Per chart review patient diagnosed with left cervicalgia with intermittent left radiculopathy.  Imaging with bone spur.  Patient advised to schedule follow-up with Ortho and consider physical therapy.   Discussed the importance of glycemic control.  Continue follow-up with endocrinology.  Patient encouraged to consider  counseling given chronic conditions.  Considering EAP.  Return in about 2 weeks (around 09/10/2023) for chronic conditions with pcp.  Patient advised this provider is unable to accept her as a transfer.  Viola Greulich, MD

## 2023-08-28 ENCOUNTER — Encounter: Payer: Self-pay | Admitting: Internal Medicine

## 2023-08-28 ENCOUNTER — Other Ambulatory Visit: Payer: Self-pay | Admitting: Family Medicine

## 2023-08-28 ENCOUNTER — Other Ambulatory Visit (HOSPITAL_COMMUNITY): Payer: Self-pay

## 2023-08-28 ENCOUNTER — Ambulatory Visit: Payer: Self-pay | Admitting: Family Medicine

## 2023-08-28 DIAGNOSIS — B3731 Acute candidiasis of vulva and vagina: Secondary | ICD-10-CM

## 2023-08-28 LAB — CERVICOVAGINAL ANCILLARY ONLY
Bacterial Vaginitis (gardnerella): NEGATIVE
Candida Glabrata: NEGATIVE
Candida Vaginitis: POSITIVE — AB
Comment: NEGATIVE
Comment: NEGATIVE
Comment: NEGATIVE
Comment: NEGATIVE
Trichomonas: NEGATIVE

## 2023-08-28 MED ORDER — FLUCONAZOLE 150 MG PO TABS
ORAL_TABLET | ORAL | 0 refills | Status: DC
  Filled 2023-08-28: qty 2, 3d supply, fill #0

## 2023-08-28 NOTE — Telephone Encounter (Signed)
 Spoke to patient about recent labs and Dr notes. No other questions or concerns at this time.

## 2023-09-10 ENCOUNTER — Ambulatory Visit: Admitting: Internal Medicine

## 2023-11-16 ENCOUNTER — Other Ambulatory Visit: Payer: Self-pay

## 2023-11-16 ENCOUNTER — Encounter: Payer: Self-pay | Admitting: Internal Medicine

## 2023-11-18 ENCOUNTER — Other Ambulatory Visit (HOSPITAL_COMMUNITY): Payer: Self-pay

## 2023-11-18 ENCOUNTER — Encounter: Payer: Self-pay | Admitting: Internal Medicine

## 2023-11-18 ENCOUNTER — Ambulatory Visit: Admitting: Internal Medicine

## 2023-11-18 VITALS — BP 130/80 | HR 114 | Temp 98.4°F | Wt 167.7 lb

## 2023-11-18 DIAGNOSIS — G629 Polyneuropathy, unspecified: Secondary | ICD-10-CM

## 2023-11-18 DIAGNOSIS — E042 Nontoxic multinodular goiter: Secondary | ICD-10-CM | POA: Diagnosis not present

## 2023-11-18 DIAGNOSIS — B3731 Acute candidiasis of vulva and vagina: Secondary | ICD-10-CM | POA: Insufficient documentation

## 2023-11-18 DIAGNOSIS — E1169 Type 2 diabetes mellitus with other specified complication: Secondary | ICD-10-CM | POA: Diagnosis not present

## 2023-11-18 DIAGNOSIS — E1165 Type 2 diabetes mellitus with hyperglycemia: Secondary | ICD-10-CM

## 2023-11-18 DIAGNOSIS — Z7985 Long-term (current) use of injectable non-insulin antidiabetic drugs: Secondary | ICD-10-CM

## 2023-11-18 DIAGNOSIS — E785 Hyperlipidemia, unspecified: Secondary | ICD-10-CM

## 2023-11-18 LAB — POCT GLYCOSYLATED HEMOGLOBIN (HGB A1C): Hemoglobin A1C: 13.2 % — AB (ref 4.0–5.6)

## 2023-11-18 MED ORDER — OZEMPIC (0.25 OR 0.5 MG/DOSE) 2 MG/3ML ~~LOC~~ SOPN
0.5000 mg | PEN_INJECTOR | SUBCUTANEOUS | 2 refills | Status: AC
Start: 2023-11-18 — End: ?
  Filled 2023-11-18: qty 3, 28d supply, fill #0

## 2023-11-18 MED ORDER — ATORVASTATIN CALCIUM 20 MG PO TABS
20.0000 mg | ORAL_TABLET | Freq: Every day | ORAL | 1 refills | Status: AC
  Filled 2023-11-18: qty 30, 30d supply, fill #0

## 2023-11-18 MED ORDER — FLUCONAZOLE 100 MG PO TABS
100.0000 mg | ORAL_TABLET | Freq: Every day | ORAL | 0 refills | Status: DC
  Filled 2023-11-18: qty 1, 1d supply, fill #0

## 2023-11-18 NOTE — Assessment & Plan Note (Signed)
 Recurrent episodes are due to uncontrolled diabetes.  Sent fluconazole  today.

## 2023-11-18 NOTE — Assessment & Plan Note (Addendum)
 Diabetes is very uncontrolled with an A1c of 13.1.  She is not taking any medications, because of side effects experienced with high dose Ozempic , I will decrease dose.  I have advised her to follow-up with endocrinology and I will also send her for an eye exam today.  Advised 71-month follow-up.

## 2023-11-18 NOTE — Assessment & Plan Note (Signed)
 Advised to follow-up with endocrinology.

## 2023-11-18 NOTE — Assessment & Plan Note (Signed)
 Resume atorvastatin , recheck lipids next visit.

## 2023-11-18 NOTE — Progress Notes (Signed)
 Established Patient Office Visit     CC/Reason for Visit: Follow-up chronic conditions  HPI: Wanda [name] is a 43 y.o. female who is coming in today for the above mentioned reasons. Past Medical History is significant for: Uncontrolled diabetes with peripheral neuropathy, hyperlipidemia associated with diabetes and obesity.  She also has frequent vaginal yeast infections and is requesting fluconazole  today.  She is not adherent to medical therapy.  She has not taken any medications.  She has not follow-up with endocrinology in some time.  She is overdue for an eye exam.  She last took Ozempic  about 6 weeks ago and had severe vomiting and has not taken any diabetic medications since.   Past Medical/Surgical History: Past Medical History:  Diagnosis Date   Anemia    Hx   Anxiety    Hx - no current meds   Depression    hx - no meds currently   Diabetes mellitus    type 2   DM (diabetes mellitus), type 2 (HCC)    Goiter    recent dx 06/2015 MD is watching, no meds    Hyperlipidemia associated with type 2 diabetes mellitus (HCC)    Peripheral neuropathy     Past Surgical History:  Procedure Laterality Date   CESAREAN SECTION  02/2009   DILATION AND CURETTAGE OF UTERUS  12/2017   INNER EAR SURGERY Right    rupture eardrum   LAPAROSCOPIC UNILATERAL SALPINGECTOMY Left 08/24/2015   Procedure: LAPAROSCOPIC Left  SALPINGECTOMY with  Lysis of  Omental and Left Para Tubal Adhesions;  Surgeon: Marie-Lyne Lavoie, MD;  Location: [facility];  Service: Gynecology;  Laterality: Left;   oophrectomy     right   ROBOTIC ASSISTED LAPAROSCOPIC HYSTERECTOMY AND SALPINGECTOMY  08/24/2015   Procedure: ROBOTIC ASSISTED LAPAROSCOPIC  Left SALPINGECTOMY;  Surgeon: Marie-Lyne Lavoie, MD;  Location: [facility];  Service: Gynecology;;    Social History:  reports that she has never smoked. She has never used smokeless tobacco. She reports that she does not currently use alcohol. She reports that she  does not use drugs.  Allergies: Allergies  Allergen Reactions   Metformin  And Related     diarrhea    Family History:  Family History  Problem Relation Age of Onset   Hypertension Mother    Stroke Mother    Cancer Father        multiple myeloma   Multiple myeloma Father    Hypertension Father    Cancer Paternal Aunt        colon   Breast cancer Maternal Grandmother    Cancer Maternal Grandmother    Breast cancer Paternal Grandmother    Multiple myeloma Paternal Grandfather    Cancer Paternal Grandfather    Cancer Paternal Uncle      Current Outpatient Medications:    Alcohol Swabs (SM ALCOHOL PREP) 70 % PADS, USE AS DIRECTED for 30, Disp: , Rfl:    Blood Glucose Monitoring Suppl (CONTOUR NEXT MONITOR) w/Device KIT, Use in the morning, at noon, in the evening, and at bedtime to monitor glucose., Disp: 1 kit, Rfl: 0   Continuous Blood Gluc Sensor (DEXCOM G7 SENSOR) MISC, 1 Device by Does not apply route as directed., Disp: 9 each, Rfl: 3   fluconazole  (DIFLUCAN ) 150 MG tablet, Take 1 tablet now.  Repeat dose in 3 days., Disp: 2 tablet, Rfl: 0   gabapentin  (NEURONTIN ) 100 MG capsule, Take 1 capsule (100 mg total) by mouth at bedtime for 14  days., Disp: 14 capsule, Rfl: 0   glucose blood (CONTOUR NEXT TEST) test strip, Use in the morning, at noon, in the evening, and at bedtime., Disp: 400 strip, Rfl: 3   glucose blood (ONETOUCH VERIO) test strip, 1 each by Other route 3 (three) times daily. Use as instructed, Disp: 300 each, Rfl: 2   glucose blood test strip, testing once a week, Disp: , Rfl:    insulin  glargine (LANTUS  SOLOSTAR) 100 UNIT/ML Solostar Pen, Inject 16 Units into the skin daily., Disp: 15 mL, Rfl: 3   insulin  lispro (HUMALOG  KWIKPEN) 100 UNIT/ML KwikPen, Inject 4 Units into the skin 3 (three) times daily. Max daily 30 units Blood sugar before meal Number of units to inject Less than 165 0 unit 166 -  200 1 units 201 -  235 2 units 236 -  270 3 units 271 -  305 4 units  306 -  340 5 units 341 -  375 6 units 376 -  410 7 units, Disp: 15 mL, Rfl: 2   Insulin  Pen Needle 32G X 4 MM MISC, Use to inject insulin  up to 4 times daily as directed., Disp: 400 each, Rfl: 3   meloxicam  (MOBIC ) 15 MG tablet, Take 1 tablet (15 mg total) by mouth daily., Disp: 14 tablet, Rfl: 0   methocarbamol  (ROBAXIN ) 500 MG tablet, Take 1 tablet (500 mg total) by mouth 2 (two) times daily for 14 days., Disp: 28 tablet, Rfl: 0   Microlet Lancets MISC, Use to monitor glucose 4 times daily., Disp: 100 each, Rfl: 11   pioglitazone  (ACTOS ) 15 MG tablet, Take 1 tablet (15 mg total) by mouth daily., Disp: 90 tablet, Rfl: 3   scopolamine  (TRANSDERM-SCOP) 1 MG/3DAYS, Place 1 patch (1.5 mg total) onto the skin every 3 (three) days., Disp: 10 patch, Rfl: 12   Semaglutide ,0.25 or 0.5MG /DOS, (OZEMPIC , 0.25 OR 0.5 MG/DOSE,) 2 MG/3ML SOPN, Inject 0.5 mg into the skin once a week., Disp: 3 mL, Rfl: 2   atorvastatin  (LIPITOR) 20 MG tablet, Take 1 tablet (20 mg total) by mouth daily., Disp: 90 tablet, Rfl: 1   fluconazole  (DIFLUCAN ) 100 MG tablet, Take 1 tablet (100 mg total) by mouth once as directed, Disp: 1 tablet, Rfl: 0  Review of Systems:  Negative unless indicated in HPI.   Physical Exam: Vitals:   11/18/23 0710  BP: 130/80  Pulse: (!) 114  Temp: 98.4 F (36.9 C)  TempSrc: Oral  SpO2: 96%  Weight: 167 lb 11.2 oz (76.1 kg)    Body mass index is 31.69 kg/m.   Physical Exam Vitals reviewed.  Constitutional:      Appearance: Normal appearance.  HENT:     Head: Normocephalic and atraumatic.  Eyes:     Conjunctiva/sclera: Conjunctivae normal.  Cardiovascular:     Rate and Rhythm: Normal rate and regular rhythm.  Pulmonary:     Effort: Pulmonary effort is normal.     Breath sounds: Normal breath sounds.  Skin:    General: Skin is warm and dry.  Neurological:     General: No focal deficit present.     Mental Status: She is alert and oriented to person, place, and time.   Psychiatric:        Mood and Affect: Mood normal.        Behavior: Behavior normal.        Thought Content: Thought content normal.        Judgment: Judgment normal.     Flowsheet Row  Office Visit from 11/18/2023 in [facility] [place]  PHQ-9 Total Score 14    Impression and Plan:  Type 2 diabetes mellitus with hyperglycemia, with long-term current use of insulin  [facility]) Assessment & Plan: Diabetes is very uncontrolled with an A1c of 13.1.  She is not taking any medications, because of side effects experienced with high dose Ozempic , I will decrease dose.  I have advised her to follow-up with endocrinology and I will also send her for an eye exam today.  Advised 42-month follow-up.  Orders: -     POCT glycosylated hemoglobin (Hb A1C) -     Ozempic  (0.25 or 0.5 MG/DOSE); Inject 0.5 mg into the skin once a week.  Dispense: 3 mL; Refill: 2 -     Ambulatory referral to Ophthalmology  Hyperlipidemia associated with type 2 diabetes mellitus (HCC) Assessment & Plan: Resume atorvastatin , recheck lipids next visit.  Orders: -     Atorvastatin  Calcium ; Take 1 tablet (20 mg total) by mouth daily.  Dispense: 90 tablet; Refill: 1  Multinodular goiter Assessment & Plan: Advised to follow-up with endocrinology.   Peripheral polyneuropathy Assessment & Plan: Due to uncontrolled diabetes.   Yeast vaginitis Assessment & Plan: Recurrent episodes are due to uncontrolled diabetes.  Sent fluconazole  today.  Orders: -     Fluconazole ; Take 1 tablet (100 mg total) by mouth once as directed  Dispense: 1 tablet; Refill: 0     Time spent:32 minutes reviewing chart, interviewing and examining patient and formulating plan of care.     Tully Theophilus Andrews, MD [facility]

## 2023-11-18 NOTE — Assessment & Plan Note (Signed)
 Due to uncontrolled diabetes.

## 2023-11-21 ENCOUNTER — Other Ambulatory Visit (HOSPITAL_COMMUNITY): Payer: Self-pay

## 2024-01-22 ENCOUNTER — Ambulatory Visit: Admission: RE | Admit: 2024-01-22 | Discharge: 2024-01-22 | Disposition: A | Discharge status: Home / Self Care

## 2024-01-22 ENCOUNTER — Ambulatory Visit

## 2024-01-22 ENCOUNTER — Other Ambulatory Visit: Payer: Self-pay

## 2024-01-22 VITALS — BP 128/82 | HR 95 | Temp 98.0°F | Resp 17 | Ht 61.0 in | Wt 167.0 lb

## 2024-01-22 DIAGNOSIS — B3731 Acute candidiasis of vulva and vagina: Secondary | ICD-10-CM | POA: Diagnosis present

## 2024-01-22 DIAGNOSIS — E1165 Type 2 diabetes mellitus with hyperglycemia: Secondary | ICD-10-CM | POA: Diagnosis present

## 2024-01-22 DIAGNOSIS — Z794 Long term (current) use of insulin: Secondary | ICD-10-CM | POA: Diagnosis present

## 2024-01-22 LAB — POCT URINE DIPSTICK
Bilirubin, UA: NEGATIVE
Glucose, UA: >=1000 mg/dL — AB
Ketones, POC UA: NEGATIVE mg/dL
Leukocytes, UA: NEGATIVE
Nitrite, UA: NEGATIVE
POC PROTEIN,UA: NEGATIVE
Spec Grav, UA: 1.01 (ref 1.010–1.025)
Urobilinogen, UA: 0.2 U/dL
pH, UA: 5.5 (ref 5.0–8.0)

## 2024-01-22 LAB — GLUCOSE, POCT (MANUAL RESULT ENTRY): POC Glucose: 258 mg/dL — AB (ref 70–99)

## 2024-01-22 MED ORDER — FLUCONAZOLE 150 MG PO TABS
150.0000 mg | ORAL_TABLET | Freq: Every day | ORAL | 0 refills | Status: DC
  Filled 2024-01-22: qty 2, 2d supply, fill #0

## 2024-01-22 MED ORDER — POLYETHYLENE GLYCOL 3350 17 GM/SCOOP PO POWD
17.0000 g | Freq: Every day | ORAL | 0 refills | Status: AC
  Filled 2024-01-22: qty 238, 14d supply, fill #0

## 2024-01-22 MED ORDER — PHENAZOPYRIDINE HCL 200 MG PO TABS
200.0000 mg | ORAL_TABLET | Freq: Three times a day (TID) | ORAL | 0 refills | Status: AC
  Filled 2024-01-22: qty 6, 2d supply, fill #0

## 2024-01-22 NOTE — ED Triage Notes (Signed)
 Pt presents with complaints of dysuria and vaginal itching/discharge x 3 days. Currently denies pain sitting in triage. Pain with urination voiced (burning sensation).   Also reports constipation x 1.5 weeks. Last BM was today and it was abnormal per pt description.

## 2024-01-22 NOTE — ED Provider Notes (Signed)
 [facility]  Note:  This document was prepared using Dragon voice recognition software and may include unintentional dictation errors.  MRN: [id number] DOB: 11-11-1980  Subjective:   Wanda [name] is a 43 y.o. female presenting for dysuria, vaginal itching and discharge x 2 to 3 days.  Patient reports a burning sensation with urination.  Patient reports past history of UTIs and vaginal yeast infection.  Patient feels like this sensation is more severe than normal yeast infection.  Patient also reports constipation x 1.5 weeks with 1 small bowel movement today that was very hard.  Patient is not currently taking any medication to treat constipation symptoms.  No shortness of breath, chest pain, weakness, dizziness, nausea/vomiting, severe abdominal pain or flank pain.  No current facility-administered medications for this encounter.  Current Outpatient Medications:    fluconazole  (DIFLUCAN ) 150 MG tablet, Take 1 tablet (150 mg total) by mouth daily. If symptoms persist take second dose of Diflucan  150 mg on day 3 to treat vaginal Candida., Disp: 2 tablet, Rfl: 0   phenazopyridine (PYRIDIUM) 200 MG tablet, Take 1 tablet (200 mg total) by mouth 3 (three) times daily., Disp: 6 tablet, Rfl: 0   polyethylene glycol powder (GLYCOLAX/MIRALAX) 17 GM/SCOOP powder, Take 17 g by mouth daily. Dissolve 1 capful (17g) in 4-8 ounces of liquid and take by mouth daily., Disp: 238 g, Rfl: 0   Alcohol Swabs (SM ALCOHOL PREP) 70 % PADS, USE AS DIRECTED for 30, Disp: , Rfl:    atorvastatin  (LIPITOR) 20 MG tablet, Take 1 tablet (20 mg total) by mouth daily., Disp: 90 tablet, Rfl: 1   Blood Glucose Monitoring Suppl (CONTOUR NEXT MONITOR) w/Device KIT, Use in the morning, at noon, in the evening, and at bedtime to monitor glucose., Disp: 1 kit, Rfl: 0   Continuous Blood Gluc Sensor (DEXCOM G7 SENSOR) MISC, 1 Device by Does not apply route as directed., Disp: 9 each, Rfl: 3   gabapentin   (NEURONTIN ) 100 MG capsule, Take 1 capsule (100 mg total) by mouth at bedtime for 14 days., Disp: 14 capsule, Rfl: 0   glucose blood (CONTOUR NEXT TEST) test strip, Use in the morning, at noon, in the evening, and at bedtime., Disp: 400 strip, Rfl: 3   glucose blood (ONETOUCH VERIO) test strip, 1 each by Other route 3 (three) times daily. Use as instructed, Disp: 300 each, Rfl: 2   glucose blood test strip, testing once a week, Disp: , Rfl:    insulin  glargine (LANTUS  SOLOSTAR) 100 UNIT/ML Solostar Pen, Inject 16 Units into the skin daily., Disp: 15 mL, Rfl: 3   insulin  lispro (HUMALOG  KWIKPEN) 100 UNIT/ML KwikPen, Inject 4 Units into the skin 3 (three) times daily. Max daily 30 units Blood sugar before meal Number of units to inject Less than 165 0 unit 166 -  200 1 units 201 -  235 2 units 236 -  270 3 units 271 -  305 4 units 306 -  340 5 units 341 -  375 6 units 376 -  410 7 units, Disp: 15 mL, Rfl: 2   Insulin  Pen Needle 32G X 4 MM MISC, Use to inject insulin  up to 4 times daily as directed., Disp: 400 each, Rfl: 3   meloxicam  (MOBIC ) 15 MG tablet, Take 1 tablet (15 mg total) by mouth daily., Disp: 14 tablet, Rfl: 0   methocarbamol  (ROBAXIN ) 500 MG tablet, Take 1 tablet (500 mg total) by mouth 2 (two) times daily for 14 days., Disp:  28 tablet, Rfl: 0   Microlet Lancets MISC, Use to monitor glucose 4 times daily., Disp: 100 each, Rfl: 11   pioglitazone  (ACTOS ) 15 MG tablet, Take 1 tablet (15 mg total) by mouth daily., Disp: 90 tablet, Rfl: 3   scopolamine  (TRANSDERM-SCOP) 1 MG/3DAYS, Place 1 patch (1.5 mg total) onto the skin every 3 (three) days., Disp: 10 patch, Rfl: 12   Semaglutide ,0.25 or 0.5MG /DOS, (OZEMPIC , 0.25 OR 0.5 MG/DOSE,) 2 MG/3ML SOPN, Inject 0.5 mg into the skin once a week., Disp: 3 mL, Rfl: 2   Allergies  Allergen Reactions   Metformin  And Related     diarrhea    Past Medical History:  Diagnosis Date   Anemia    Hx   Anxiety    Hx - no current meds   Depression    hx  - no meds currently   Diabetes mellitus    type 2   DM (diabetes mellitus), type 2 (HCC)    Goiter    recent dx 06/2015 MD is watching, no meds    Hyperlipidemia associated with type 2 diabetes mellitus (HCC)    Peripheral neuropathy      Past Surgical History:  Procedure Laterality Date   CESAREAN SECTION  02/2009   DILATION AND CURETTAGE OF UTERUS  12/2017   INNER EAR SURGERY Right    rupture eardrum   LAPAROSCOPIC UNILATERAL SALPINGECTOMY Left 08/24/2015   Procedure: LAPAROSCOPIC Left  SALPINGECTOMY with  Lysis of  Omental and Left Para Tubal Adhesions;  Surgeon: Marie-Lyne Lavoie, MD;  Location: [facility];  Service: Gynecology;  Laterality: Left;   oophrectomy     right   ROBOTIC ASSISTED LAPAROSCOPIC HYSTERECTOMY AND SALPINGECTOMY  08/24/2015   Procedure: ROBOTIC ASSISTED LAPAROSCOPIC  Left SALPINGECTOMY;  Surgeon: Percilla Burly, MD;  Location: [facility];  Service: Gynecology;;    Family History  Problem Relation Age of Onset   Hypertension Mother    Stroke Mother    Cancer Father        multiple myeloma   Multiple myeloma Father    Hypertension Father    Cancer Paternal Aunt        colon   Breast cancer Maternal Grandmother    Cancer Maternal Grandmother    Breast cancer Paternal Grandmother    Multiple myeloma Paternal Grandfather    Cancer Paternal Grandfather    Cancer Paternal Uncle     Social History   Tobacco Use   Smoking status: Never   Smokeless tobacco: Never  Vaping Use   Vaping status: Never Used  Substance Use Topics   Alcohol use: Not Currently    Comment: occ   Drug use: No    ROS Refer to HPI for ROS details.  Objective:   Vitals: BP 128/82 (BP Location: Right Arm)   Pulse 95   Temp 98 F (36.7 C) (Oral)   Resp 17   Ht 5' 1 (1.549 m)   Wt 167 lb (75.8 kg)   SpO2 99%   BMI 31.55 kg/m   Physical Exam Vitals and nursing note reviewed.  Constitutional:      General: She is not in acute distress.    Appearance: Normal  appearance. She is well-developed. She is not ill-appearing or toxic-appearing.  HENT:     Head: Normocephalic and atraumatic.     Mouth/Throat:     Mouth: Mucous membranes are moist.  Cardiovascular:     Rate and Rhythm: Normal rate.  Pulmonary:     Effort: Pulmonary  effort is normal. No respiratory distress.     Breath sounds: No stridor. No wheezing.  Abdominal:     General: Bowel sounds are normal. There is no distension.     Palpations: Abdomen is soft.     Tenderness: There is no abdominal tenderness. There is no right CVA tenderness or left CVA tenderness.  Genitourinary:    Vagina: Vaginal discharge present.  Skin:    General: Skin is warm and dry.  Neurological:     General: No focal deficit present.     Mental Status: She is alert and oriented to person, place, and time.  Psychiatric:        Mood and Affect: Mood normal.        Behavior: Behavior normal.     Procedures  Results for orders placed or performed during the hospital encounter of 01/22/24 (from the past 24 hours)  POCT URINE DIPSTICK     Status: Abnormal   Collection Time: 01/22/24  7:39 PM  Result Value Ref Range   Color, UA light yellow (A) yellow   Clarity, UA clear clear   Glucose, UA >=1,000 (A) negative mg/dL   Bilirubin, UA negative negative   Ketones, POC UA negative negative mg/dL   Spec Grav, UA 8.989 8.989 - 1.025   Blood, UA trace-intact (A) negative   pH, UA 5.5 5.0 - 8.0   POC PROTEIN,UA negative negative, trace   Urobilinogen, UA 0.2 0.2 or 1.0 E.U./dL   Nitrite, UA Negative Negative   Leukocytes, UA Negative Negative  POCT CBG (manual entry)     Status: Abnormal   Collection Time: 01/22/24  7:41 PM  Result Value Ref Range   POC Glucose 258 (A) 70 - 99 mg/dl    No results found.   Assessment and Plan :     Discharge Instructions       1. Type 2 diabetes mellitus with hyperglycemia, with long-term current use of insulin  (HCC) (Primary) - POCT CBG (manual entry) reveals  blood glucose of 258 mg/dL - polyethylene glycol powder (GLYCOLAX/MIRALAX) 17 GM/SCOOP powder; Take 17 g by mouth daily. Dissolve 1 capful (17g) in 4-8 ounces of liquid and take by mouth daily for constipation.  Dispense: 238 g; Refill: 0  2. Vaginal candida - POCT URINE DIPSTICK completed in UC shows no leukocytes, no nitrite, trace blood, these findings are not strongly indicative of urinary infection - Cervicovaginal swab collected in UC and sent to lab for further testing results should be available in 2 to 3 days.  Based on patient's previous history and report of current symptoms empiric treatment with Diflucan  will be prescribed. - Urine Culture collected in UC and sent to lab for further testing results will be available in 2 to 3 days. - fluconazole  (DIFLUCAN ) 150 MG tablet; Take 1 tablet (150 mg total) by mouth daily. If symptoms persist take second dose of Diflucan  150 mg on day 3 to treat vaginal Candida.  Dispense: 2 tablet; Refill: 0 - phenazopyridine (PYRIDIUM) 200 MG tablet; Take 1 tablet (200 mg total) by mouth 3 (three) times daily.  Dispense: 6 tablet; Refill: 0 -Continue to monitor symptoms for any change in severity if there is any escalation of current symptoms or development of new symptoms follow-up in ER for further evaluation and management.      Gerlene Glassburn B Magdaleno Lortie   Laylani Pudwill, [place], [place] 01/22/24 2027

## 2024-01-22 NOTE — Discharge Instructions (Signed)
  1. Type 2 diabetes mellitus with hyperglycemia, with long-term current use of insulin  (HCC) (Primary) - POCT CBG (manual entry) reveals blood glucose of 258 mg/dL - polyethylene glycol powder (GLYCOLAX/MIRALAX) 17 GM/SCOOP powder; Take 17 g by mouth daily. Dissolve 1 capful (17g) in 4-8 ounces of liquid and take by mouth daily for constipation.  Dispense: 238 g; Refill: 0  2. Vaginal candida - POCT URINE DIPSTICK completed in UC shows no leukocytes, no nitrite, trace blood, these findings are not strongly indicative of urinary infection - Cervicovaginal swab collected in UC and sent to lab for further testing results should be available in 2 to 3 days.  Based on patient's previous history and report of current symptoms empiric treatment with Diflucan  will be prescribed. - Urine Culture collected in UC and sent to lab for further testing results will be available in 2 to 3 days. - fluconazole  (DIFLUCAN ) 150 MG tablet; Take 1 tablet (150 mg total) by mouth daily. If symptoms persist take second dose of Diflucan  150 mg on day 3 to treat vaginal Candida.  Dispense: 2 tablet; Refill: 0 - phenazopyridine (PYRIDIUM) 200 MG tablet; Take 1 tablet (200 mg total) by mouth 3 (three) times daily.  Dispense: 6 tablet; Refill: 0 -Continue to monitor symptoms for any change in severity if there is any escalation of current symptoms or development of new symptoms follow-up in ER for further evaluation and management.

## 2024-01-23 ENCOUNTER — Other Ambulatory Visit (HOSPITAL_COMMUNITY): Payer: Self-pay

## 2024-01-24 LAB — URINE CULTURE: Special Requests: NORMAL

## 2024-01-25 ENCOUNTER — Ambulatory Visit (HOSPITAL_COMMUNITY): Payer: Self-pay

## 2024-01-25 ENCOUNTER — Other Ambulatory Visit (HOSPITAL_COMMUNITY): Payer: Self-pay

## 2024-01-25 DIAGNOSIS — B3731 Acute candidiasis of vulva and vagina: Secondary | ICD-10-CM

## 2024-01-25 DIAGNOSIS — B9689 Other specified bacterial agents as the cause of diseases classified elsewhere: Secondary | ICD-10-CM

## 2024-01-25 LAB — CERVICOVAGINAL ANCILLARY ONLY
Bacterial Vaginitis (gardnerella): POSITIVE — AB
Candida Glabrata: NEGATIVE
Candida Vaginitis: POSITIVE — AB
Chlamydia: NEGATIVE
Comment: NEGATIVE
Comment: NEGATIVE
Comment: NEGATIVE
Comment: NEGATIVE
Comment: NEGATIVE
Comment: NORMAL
Neisseria Gonorrhea: NEGATIVE
Trichomonas: NEGATIVE

## 2024-01-25 MED ORDER — METRONIDAZOLE 500 MG PO TABS
500.0000 mg | ORAL_TABLET | Freq: Two times a day (BID) | ORAL | 0 refills | Status: DC
  Filled 2024-01-25: qty 14, 7d supply, fill #0

## 2024-01-25 MED ORDER — METRONIDAZOLE 500 MG PO TABS
500.0000 mg | ORAL_TABLET | Freq: Two times a day (BID) | ORAL | 0 refills | Status: AC
  Filled 2024-01-25: qty 14, 7d supply, fill #0

## 2024-01-25 MED ORDER — FLUCONAZOLE 150 MG PO TABS
150.0000 mg | ORAL_TABLET | Freq: Every day | ORAL | 0 refills | Status: DC
  Filled 2024-01-25: qty 2, 2d supply, fill #0

## 2024-01-25 NOTE — Telephone Encounter (Signed)
 Prescription for Diflucan  for treatment of vaginal yeast infection and prescription for metronidazole  for treatment of bacterial vaginosis sent to preferred pharmacy.  Please take medications as directed and follow-up in urgent care if there is any further concerns.

## 2024-01-26 ENCOUNTER — Other Ambulatory Visit (HOSPITAL_COMMUNITY): Payer: Self-pay

## 2024-01-26 ENCOUNTER — Encounter: Payer: Self-pay | Admitting: Obstetrics and Gynecology

## 2024-01-26 ENCOUNTER — Other Ambulatory Visit: Payer: Self-pay

## 2024-01-27 ENCOUNTER — Telehealth: Payer: Self-pay

## 2024-01-27 ENCOUNTER — Encounter: Payer: Self-pay | Admitting: Obstetrics and Gynecology

## 2024-01-27 DIAGNOSIS — D251 Intramural leiomyoma of uterus: Secondary | ICD-10-CM | POA: Insufficient documentation

## 2024-01-27 NOTE — Telephone Encounter (Signed)
 RN spoke with patient per request of provider and discussed with patient that pap was up to date and that upcoming appointment would be for a problem visit. RN reported separate appointment would need to be made if in need of annual. Pt verbalized understanding with plans to attend appointment tomorrow and discuss current issues.

## 2024-01-27 NOTE — Progress Notes (Unsigned)
 GYNECOLOGY OFFICE VISIT NOTE  History:  Wanda [name] is a 43 y.o. G1P0010 here today for a few concerns:  Concern for perimenopause: See below  Irregular menses: She can bleed for up to a month with clotting the size of a quarter. She is not currently bleeding but it is unpredictable.  Vulvar itching. She was diagnosed with yeast infection 2 months ago. She was diagnosed again on 11/14. 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  A1C on 11/18/23 was 13.2.   Discussed the use of AI scribe software for clinical note transcription with the patient, who gave verbal consent to proceed.  History of Present Illness Wanda [name] is a 43 year old female who presents with abnormal uterine bleeding and concerns related to perimenopause.  She experienced regular menstrual cycles until approximately eight months ago when she had amenorrhea, initially thought to be due to menopause. Her periods resumed with heavy bleeding lasting about two months, characterized by passing clots and lumps. The bleeding ceased around January 02, 2024, but she remains concerned about potential recurrence. She associates tampon and maxi pad use with subsequent yeast infections and bacterial vaginosis.  She has a history of diabetes, which she feels is not well-controlled. She experiences confusion and difficulty managing her insulin  regimen, which includes Actos  and insulin . She previously tried Ozempic  but discontinued it due to adverse effects. She has not seen her endocrinologist recently and missed an appointment with a diabetes educator due to work commitments. She owns a Dexcom device but has not used it yet due to apprehension about its application.  Her gynecological history includes a right salpingo-oophorectomy due to fluid in the tube during IVF treatment. She has not undergone a hysterectomy. She is concerned about one of her labia minora being longer than the other, which she feels may contribute to her  recurrent yeast infections and bacterial vaginosis.  She reports mood changes, skin rashes, and vaginal dryness. She experienced a rash recently, which is mostly resolved. No recent ultrasound related to her gynecological symptoms, with the last one being a thyroid  ultrasound earlier this year.  She feels depressed and experiences mood swings. No recent bleeding since January 02, 2024.    Past Medical History:  Diagnosis Date   Anemia    Hx   Anxiety    Hx - no current meds   Depression    hx - no meds currently   Diabetes mellitus    type 2   DM (diabetes mellitus), type 2 (HCC)    Goiter    recent dx 06/2015 MD is watching, no meds    Hyperlipidemia associated with type 2 diabetes mellitus (HCC)    Peripheral neuropathy     Past Surgical History:  Procedure Laterality Date   CESAREAN SECTION  02/2009   DILATION AND CURETTAGE OF UTERUS  12/2017   INNER EAR SURGERY Right    rupture eardrum   LAPAROSCOPIC UNILATERAL SALPINGECTOMY Left 08/24/2015   Procedure: LAPAROSCOPIC Left  SALPINGECTOMY with  Lysis of  Omental and Left Para Tubal Adhesions;  Surgeon: Marie-Lyne Lavoie, MD;  Location: [facility];  Service: Gynecology;  Laterality: Left;   ROBOTIC ASSISTED LAPAROSCOPIC HYSTERECTOMY AND SALPINGECTOMY  08/24/2015   Procedure: ROBOTIC ASSISTED LAPAROSCOPIC  Left SALPINGECTOMY;  Surgeon: Percilla Burly, MD;  Location: [facility];  Service: Gynecology;;   SALPINGECTOMY     right    The following portions of the patient's history were reviewed and updated as appropriate: allergies, current medications, past family  history, past medical history, past social history, past surgical history and problem list.   Health Maintenance:   Normal pap and negative HRHPV:   Diagnosis  Date Value Ref Range Status  08/16/2021   Final   - Negative for intraepithelial lesion or malignancy (NILM)     Normal mammogram on 08/27/23.   Review of Systems:  Pertinent items noted in HPI and remainder  of comprehensive ROS otherwise negative.  Physical Exam:  BP 132/83   Pulse 87   Ht 5' 1 (1.549 m)   Wt 164 lb (74.4 kg)   LMP 01/02/2024   BMI 30.99 kg/m  CONSTITUTIONAL: Well-developed, well-nourished female in no acute distress.  HEENT:  Normocephalic, atraumatic. External right and left ear normal. No scleral icterus.  NECK: Normal range of motion, supple, no masses noted on observation SKIN: No rash noted. Not diaphoretic. No erythema. No pallor. MUSCULOSKELETAL: Normal range of motion. No edema noted. NEUROLOGIC: Alert and oriented to person, place, and time. Normal muscle tone coordination. No cranial nerve deficit noted. PSYCHIATRIC: Normal mood and affect. Normal behavior. Normal judgment and thought content.  PELVIC: Deferred  Labs and Imaging Results for orders placed or performed during the hospital encounter of 01/22/24 (from the past week)  Cervicovaginal ancillary only   Collection Time: 01/22/24  7:28 PM  Result Value Ref Range   Neisseria Gonorrhea Negative    Chlamydia Negative    Trichomonas Negative    Bacterial Vaginitis (gardnerella) Positive (A)    Candida Vaginitis Positive (A)    Candida Glabrata Negative    Comment Normal Reference Ranger Chlamydia - Negative    Comment      Normal Reference Range Neisseria Gonorrhea - Negative   Comment      Normal Reference Range Bacterial Vaginosis - Negative   Comment Normal Reference Range Candida Species - Negative    Comment Normal Reference Range Candida Galbrata - Negative    Comment Normal Reference Range Trichomonas - Negative   POCT URINE DIPSTICK   Collection Time: 01/22/24  7:39 PM  Result Value Ref Range   Color, UA light yellow (A) yellow   Clarity, UA clear clear   Glucose, UA >=1,000 (A) negative mg/dL   Bilirubin, UA negative negative   Ketones, POC UA negative negative mg/dL   Spec Grav, UA 8.989 8.989 - 1.025   Blood, UA trace-intact (A) negative   pH, UA 5.5 5.0 - 8.0   POC PROTEIN,UA  negative negative, trace   Urobilinogen, UA 0.2 0.2 or 1.0 E.U./dL   Nitrite, UA Negative Negative   Leukocytes, UA Negative Negative  POCT CBG (manual entry)   Collection Time: 01/22/24  7:41 PM  Result Value Ref Range   POC Glucose 258 (A) 70 - 99 mg/dl  Urine Culture   Collection Time: 01/22/24  8:05 PM   Specimen: Urine, Clean Catch  Result Value Ref Range   Specimen Description URINE, CLEAN CATCH    Special Requests      Normal Performed at [facility], [address] [place], [place] [address]    Culture MULTIPLE SPECIES PRESENT, SUGGEST RECOLLECTION (A)    Report Status 01/24/2024 FINAL    No results found.  Assessment and Plan:  1. Perimenopause (Primary) Could be the cause of some of her symptoms. Discussed lubricant for dryness and we will check hormone levels for possible perimenopause.   2. Irregular periods Bleeding likely due to perimenopause, but prolonged amenorrhea and subsequent bleeding require further evaluation. -  Ordered pelvic ultrasound to evaluate structural causes. - Ordered blood work to assess hormone levels and confirm perimenopausal status.  3. Vulvar itching As noted by PCP, I agree that better control of DM is the best treatment for the yeast infection.  Recurrent infections likely due to altered vaginal pH from perimenopausal bleeding. Diabetes may predispose to yeast infections. - Rechecked vaginal swabs to confirm resolution. - Provided silicone-based lubricant samples to manage dryness and prevent infections.  4. Intramural and subserous leiomyoma of uterus No recent US . Will check  5. Type 2 diabetes mellitus, not well controlled Diabetes management complicated by medication regimen. She is overwhelmed and requires assistance. Previous adverse reaction to Ozempic  noted. - Referred to diabetes educator for medication management. - Contacted endocrinologist for follow-up. - Applied Dexcom sensor for glucose monitoring. - Check TSH  as note recently done (last checked 05/2022).     No orders of the defined types were placed in this encounter.    Routine preventative health maintenance measures emphasized. Please refer to After Visit Summary for other counseling recommendations.   Return in about 1 month (around 02/27/2024) for annual.  Vina Solian, MD, FACOG Obstetrician & Gynecologist, [facility] for [facility], [facility]

## 2024-01-28 ENCOUNTER — Ambulatory Visit (INDEPENDENT_AMBULATORY_CARE_PROVIDER_SITE_OTHER): Admitting: Obstetrics and Gynecology

## 2024-01-28 ENCOUNTER — Encounter: Payer: Self-pay | Admitting: Obstetrics and Gynecology

## 2024-01-28 ENCOUNTER — Other Ambulatory Visit (HOSPITAL_COMMUNITY)
Admission: RE | Admit: 2024-01-28 | Discharge: 2024-01-28 | Disposition: A | Discharge status: Home / Self Care | Source: Ambulatory Visit | Attending: Obstetrics and Gynecology | Admitting: Obstetrics and Gynecology

## 2024-01-28 VITALS — BP 132/83 | HR 87 | Ht 61.0 in | Wt 164.0 lb

## 2024-01-28 DIAGNOSIS — N951 Menopausal and female climacteric states: Secondary | ICD-10-CM

## 2024-01-28 DIAGNOSIS — N926 Irregular menstruation, unspecified: Secondary | ICD-10-CM | POA: Diagnosis not present

## 2024-01-28 DIAGNOSIS — L292 Pruritus vulvae: Secondary | ICD-10-CM

## 2024-01-28 DIAGNOSIS — E1169 Type 2 diabetes mellitus with other specified complication: Secondary | ICD-10-CM | POA: Diagnosis not present

## 2024-01-28 DIAGNOSIS — D251 Intramural leiomyoma of uterus: Secondary | ICD-10-CM

## 2024-01-28 NOTE — Progress Notes (Signed)
 Dexcom G7 sample applied to patient on right upper arm. Education given.  Patient to follow up with endocrinologist on management.  Silvano LELON Piano, RN

## 2024-01-29 LAB — ESTRADIOL: Estradiol: 216 pg/mL

## 2024-01-29 LAB — CERVICOVAGINAL ANCILLARY ONLY
Bacterial Vaginitis (gardnerella): POSITIVE — AB
Candida Glabrata: NEGATIVE
Candida Vaginitis: POSITIVE — AB
Comment: NEGATIVE
Comment: NEGATIVE
Comment: NEGATIVE

## 2024-01-29 LAB — TSH RFX ON ABNORMAL TO FREE T4: TSH: 1.48 u[IU]/mL (ref 0.450–4.500)

## 2024-01-29 LAB — FOLLICLE STIMULATING HORMONE: FSH: 2.1 m[IU]/mL

## 2024-02-01 ENCOUNTER — Ambulatory Visit: Payer: Self-pay | Admitting: Obstetrics and Gynecology

## 2024-02-01 DIAGNOSIS — B3731 Acute candidiasis of vulva and vagina: Secondary | ICD-10-CM

## 2024-02-01 DIAGNOSIS — B9689 Other specified bacterial agents as the cause of diseases classified elsewhere: Secondary | ICD-10-CM

## 2024-02-12 ENCOUNTER — Other Ambulatory Visit

## 2024-02-18 ENCOUNTER — Other Ambulatory Visit: Payer: Self-pay

## 2024-02-18 ENCOUNTER — Other Ambulatory Visit

## 2024-02-18 ENCOUNTER — Ambulatory Visit (INDEPENDENT_AMBULATORY_CARE_PROVIDER_SITE_OTHER)

## 2024-02-18 ENCOUNTER — Other Ambulatory Visit (HOSPITAL_COMMUNITY): Payer: Self-pay

## 2024-02-18 ENCOUNTER — Encounter: Payer: Self-pay | Admitting: Internal Medicine

## 2024-02-18 ENCOUNTER — Ambulatory Visit: Admitting: Internal Medicine

## 2024-02-18 VITALS — BP 138/88 | Ht 61.0 in | Wt 165.0 lb

## 2024-02-18 DIAGNOSIS — N951 Menopausal and female climacteric states: Secondary | ICD-10-CM

## 2024-02-18 DIAGNOSIS — E1142 Type 2 diabetes mellitus with diabetic polyneuropathy: Secondary | ICD-10-CM | POA: Diagnosis not present

## 2024-02-18 DIAGNOSIS — B3731 Acute candidiasis of vulva and vagina: Secondary | ICD-10-CM

## 2024-02-18 DIAGNOSIS — E1165 Type 2 diabetes mellitus with hyperglycemia: Secondary | ICD-10-CM

## 2024-02-18 DIAGNOSIS — N926 Irregular menstruation, unspecified: Secondary | ICD-10-CM | POA: Diagnosis not present

## 2024-02-18 DIAGNOSIS — Z794 Long term (current) use of insulin: Secondary | ICD-10-CM

## 2024-02-18 DIAGNOSIS — B9689 Other specified bacterial agents as the cause of diseases classified elsewhere: Secondary | ICD-10-CM

## 2024-02-18 LAB — POCT GLYCOSYLATED HEMOGLOBIN (HGB A1C): Hemoglobin A1C: 11.7 % — AB (ref 4.0–5.6)

## 2024-02-18 MED ORDER — INSULIN PEN NEEDLE 32G X 4 MM MISC: 1.0000 | Freq: Four times a day (QID) | 3 refills | Status: AC

## 2024-02-18 MED ORDER — FLUCONAZOLE 150 MG PO TABS
150.0000 mg | ORAL_TABLET | Freq: Once | ORAL | 0 refills | Status: AC
  Filled 2024-02-18: qty 1, 1d supply, fill #0

## 2024-02-18 MED ORDER — METRONIDAZOLE 500 MG PO TABS
500.0000 mg | ORAL_TABLET | Freq: Two times a day (BID) | ORAL | 0 refills | Status: AC
  Filled 2024-02-18: qty 14, 7d supply, fill #0

## 2024-02-18 MED ORDER — DEXCOM G7 SENSOR MISC: 1.0000 | 3 refills | Status: AC

## 2024-02-18 MED ORDER — LANTUS SOLOSTAR 100 UNIT/ML ~~LOC~~ SOPN: 16.0000 [IU] | PEN_INJECTOR | Freq: Every day | SUBCUTANEOUS | 3 refills | Status: AC

## 2024-02-18 MED ORDER — INSULIN LISPRO (1 UNIT DIAL) 100 UNIT/ML (KWIKPEN): 4.0000 [IU] | PEN_INJECTOR | Freq: Three times a day (TID) | SUBCUTANEOUS | 2 refills | Status: AC

## 2024-02-18 NOTE — Progress Notes (Signed)
 Name: Wanda [name]  MRN/ DOB: [id number], 26-Aug-1980   Age/ Sex: 43 y.o., female    PCP: Theophilus Andrews, Tully GRADE, MD   Reason for Endocrinology Evaluation: Type 2 Diabetes Mellitus     Date of Initial Endocrinology Visit: 12/21/2019    PATIENT IDENTIFIER: Wanda [name] is a 43 y.o. female with a past medical history of T2DM , dyslipidemia and MNG. The patient presented for initial endocrinology clinic visit on 12/21/2019 for consultative assistance with her diabetes management.    HPI: Ms. Schreier was    Diagnosed with DM in 2012 Prior Medications tried/Intolerance: Invokana- does not recall any side effects, Trulicity- nausea  , Metformin  - GI side effects            Hemoglobin A1c has ranged from 11.8%in 2016, peaking at 12.7% in 2021.  Started pioglitazone  05/2022 with an A1c of 10.4%  She was referred to our CDE for CGM training 2024  She discontinued Ozempic  in 2025 due to GI side effects    THYROID  HISTORY: She has been diagnosed with MNG in 2017. She is S/P benign FNA of the right superior and right inferior nodules.  She denies any local worsening of enlargement   SUBJECTIVE:   During the last visit (05/12/2023): A1c 11.2 %     Today (02/18/2024): Wanda [name] is here for follow-up on diabetes management.  She has NOT been to our clinic in 9 months.   She did have a recent A1c at 13.2% on 11/18/2023 through an outside facility She had an evaluation for menopause and falls through gynecology, they applied the Dexcom on her.  She has been having recurrent vaginal infections Stable local neck swelling    She has not been consistent with medication intake  She doesn't know which insulin  is what  She has been taking humalog  after the meals ~ 2 units, developed nausea  Has noted constipation but no diarrhea    HOME DIABETES REGIMEN: Pioglitazone  15 mg daily - not taking  Lantus  16 units daily - not taking  Humalog  (BG-130/35) TIDQAC- not  taking      Statin: yes ACE-I/ARB: no Prior Diabetic Education: yes   CONTINUOUS GLUCOSE MONITORING RECORD INTERPRETATION    Dates of Recording: 11/16-11/29/2025  Sensor description: dexcom  Results statistics:   CGM use % of time 93  Average and SD 316/46  Time in range     0   %  % Time Above 180 10  % Time above 250 90  % Time Below target 0   Glycemic patterns summary: Patient has been noted with persistent hyperglycemia throughout the day and night  Hyperglycemic episodes all day and night  Hypoglycemic episodes occurred N/A  Overnight periods: High    DIABETIC COMPLICATIONS: Microvascular complications:  Neuropathy Denies: CKD  Last eye exam: Completed 02/2022   Macrovascular complications:   Denies: CAD, PVD, CVA   PAST HISTORY: Past Medical History:  Past Medical History:  Diagnosis Date   Anemia    Hx   Anxiety    Hx - no current meds   Depression    hx - no meds currently   Diabetes mellitus    type 2   DM (diabetes mellitus), type 2 (HCC)    Goiter    recent dx 06/2015 MD is watching, no meds    Hyperlipidemia associated with type 2 diabetes mellitus (HCC)    Peripheral neuropathy    Past Surgical History:  Past Surgical History:  Procedure Laterality Date   CESAREAN SECTION  02/2009   DILATION AND CURETTAGE OF UTERUS  12/2017   INNER EAR SURGERY Right    rupture eardrum   LAPAROSCOPIC UNILATERAL SALPINGECTOMY Left 08/24/2015   Procedure: LAPAROSCOPIC Left  SALPINGECTOMY with  Lysis of  Omental and Left Para Tubal Adhesions;  Surgeon: Percilla Burly, MD;  Location: [facility];  Service: Gynecology;  Laterality: Left;   ROBOTIC ASSISTED LAPAROSCOPIC HYSTERECTOMY AND SALPINGECTOMY  08/24/2015   Procedure: ROBOTIC ASSISTED LAPAROSCOPIC  Left SALPINGECTOMY;  Surgeon: Percilla Burly, MD;  Location: [facility];  Service: Gynecology;;   SALPINGECTOMY     right    Social History:  reports that she has never smoked. She has never used  smokeless tobacco. She reports that she does not currently use alcohol. She reports that she does not use drugs.    Family History:  Family History  Problem Relation Age of Onset   Hypertension Mother    Stroke Mother    Cancer Father        multiple myeloma   Multiple myeloma Father    Hypertension Father    Cancer Paternal Aunt        colon   Breast cancer Maternal Grandmother    Cancer Maternal Grandmother    Breast cancer Paternal Grandmother    Multiple myeloma Paternal Grandfather    Cancer Paternal Grandfather    Cancer Paternal Uncle      HOME MEDICATIONS: Allergies as of 02/18/2024       Reactions   Metformin  And Related    diarrhea        Medication List        Accurate as of February 18, 2024  8:26 AM. If you have any questions, ask your nurse or doctor.          STOP taking these medications    fluconazole  150 MG tablet Commonly known as: Diflucan  Stopped by: Donell Butts, MD   Ozempic  (0.25 or 0.5 MG/DOSE) 2 MG/3ML Sopn Generic drug: Semaglutide (0.25 or 0.5MG /DOS) Stopped by: Donell Butts, MD   pioglitazone  15 MG tablet Commonly known as: Actos  Stopped by: Donell Butts, MD       TAKE these medications    atorvastatin  20 MG tablet Commonly known as: LIPITOR Take 1 tablet (20 mg total) by mouth daily.   Contour Next Gen Monitor w/Device Kit Use in the morning, at noon, in the evening, and at bedtime to monitor glucose.   Dexcom G7 Sensor Misc 1 Device by Does not apply route as directed. What changed: Another medication with the same name was added. Make sure you understand how and when to take each. Changed by: Donell Butts, MD   Dexcom G7 Sensor Misc 1 Device by Does not apply route as directed. Every 10 days What changed: You were already taking a medication with the same name, and this prescription was added. Make sure you understand how and when to take each. Changed by: Donell Butts, MD    gabapentin  100 MG capsule Commonly known as: NEURONTIN  Take 1 capsule (100 mg total) by mouth at bedtime for 14 days.   insulin  lispro 100 UNIT/ML KwikPen Commonly known as: HumaLOG  KwikPen Inject 4 Units into the skin 3 (three) times daily. What changed: additional instructions Changed by: Donell Butts, MD   Insulin  Pen Needle 32G X 4 MM Misc 1 Device by Does not apply route in the morning, at noon, in the evening, and at bedtime. What changed: You were already taking  a medication with the same name, and this prescription was added. Make sure you understand how and when to take each. Changed by: Donell Butts, MD   Insulin  Pen Needle 32G X 4 MM Misc Use to inject insulin  up to 4 times daily as directed. What changed: Another medication with the same name was added. Make sure you understand how and when to take each. Changed by: Donell Butts, MD   Lantus  SoloStar 100 UNIT/ML Solostar Pen Generic drug: insulin  glargine Inject 16 Units into the skin daily.   meloxicam  15 MG tablet Commonly known as: MOBIC  Take 1 tablet (15 mg total) by mouth daily.   methocarbamol  500 MG tablet Commonly known as: ROBAXIN  Take 1 tablet (500 mg total) by mouth 2 (two) times daily for 14 days.   Microlet Lancets Misc Use to monitor glucose 4 times daily.   glucose blood test strip testing once a week   OneTouch Verio test strip Generic drug: glucose blood 1 each by Other route 3 (three) times daily. Use as instructed   Contour Next Test test strip Generic drug: glucose blood Use in the morning, at noon, in the evening, and at bedtime.   phenazopyridine  200 MG tablet Commonly known as: PYRIDIUM  Take 1 tablet (200 mg total) by mouth 3 (three) times daily.   polyethylene glycol powder 17 GM/SCOOP powder Commonly known as: GLYCOLAX /MIRALAX  Take 17 g by mouth daily. Dissolve 1 capful (17g) in 4-8 ounces of liquid and take by mouth daily.   scopolamine  1  MG/3DAYS Commonly known as: TRANSDERM-SCOP Place 1 patch (1.5 mg total) onto the skin every 3 (three) days.   SM Alcohol Prep 70 % Pads USE AS DIRECTED for 30         ALLERGIES: Allergies  Allergen Reactions   Metformin  And Related     diarrhea         OBJECTIVE:   VITAL SIGNS: BP 138/88   Ht 5' 1 (1.549 m)   Wt 165 lb (74.8 kg)   LMP 01/02/2024   BMI 31.18 kg/m    PHYSICAL EXAM:  General: Pt appears well and is in NAD  Lungs: Clear with good BS bilat with no rales, rhonchi, or wheezes  Heart: RRR   Abdomen: Soft, nontender  Extremities:  Lower extremities - No pretibial edema  Neuro: MS is good with appropriate affect, pt is alert and Ox3   DM Foot Exam 05/12/2023   The skin of the feet is intact without sores or ulcerations. The pedal pulses are 2+ on right and 2+ on left. The sensation is intact to a screening 5.07, 10 gram monofilament bilaterally    DATA REVIEWED:  Lab Results  Component Value Date   HGBA1C 10.1 (A) 05/26/2022   HGBA1C 10.4 (A) 12/03/2021   HGBA1C 13.1 (A) 07/25/2021     FNA 07/04/2019 THYROID , FINE NEEDLE ASPIRATION, RIGHT SUPERIOR NODULE (SPECIMEN 1 OF 2, COLLECTED ON 07/04/15): CONSISTENT WITH BENIGN FOLLICULAR NODULE (BETHESDA CATEGORY II).     FNA 07/04/2015 THYROID , FINE NEEDLE ASPIRATION, RIGHT INFERIOR (SPECIMEN 2 OF 2, COLLECTED ON 07/04/15): CONSISTENT WITH BENIGN FOLLICULAR NODULE (BETHESDA CATEGORY II).    ASSESSMENT / PLAN / RECOMMENDATIONS:   1) Type 2 Diabetes Mellitus, Poorly controlled, With Neuropathic  complications - Most recent A1c of 11.7 %. Goal A1c < 7.0 %.      -Patient continues with poorly diagnosed diabetes, this is due to lack of the intake of antiglycemic agents 4 months -I again reminded the patient the  importance of optimizing glucose to prevent microvascular complications including but not limited to ESRD -Patient states she is motivated to take care of her diabetes - Her GYN has applied  Dexcom on her, and she feels good with using it - Intolerant to Ozempic  - I prescribed her pioglitazone  but she did not take it - I will not provide her with a correction scale, due to sporadic glucose checks and not to overwhelm her - Emphasized the importance of taking Humalog  right before the meal and not after to prevent hypoglycemia - Patient advised to take Lantus  every morning  MEDICATIONS:  Take  Lantus  16 units daily Take Humalog  4 units with each meal   EDUCATION / INSTRUCTIONS: BG monitoring instructions: Patient is instructed to check her blood sugars 2 times a day,before breakfast and supper when possible . Call [facility] if: BG persistently < 70  I reviewed the Rule of 15 for the treatment of hypoglycemia in detail with the patient. Literature supplied.   2) Diabetic complications:  Eye: Does not have known diabetic retinopathy.  Neuro/ Feet: Does  have known diabetic peripheral neuropathy. Renal: Patient does not have known baseline CKD. She is not on an ACEI/ARB at present   3) MNG:   - Pt is clinically euthyroid  - No local neck symptoms  - S/P benign FNA of the right superior and inferior nodules in 2017 -I have ordered thyroid  ultrasound multiple times, but she did not feels to schedule an appointment, another order was placed today    F/U in 3 months       Signed electronically by: Stefano Redgie Butts, MD  [facility]  [facility] [address] [place] [place], [place] [address] Phone: [phone] FAX: [phone]   CC: Theophilus Andrews, Tully GRADE, MD [address] [place] [address] Phone: [phone]  Fax: [phone]    Return to Endocrinology clinic as below: Future Appointments  Date Time Provider Department Center  02/18/2024 10:00 AM [facility]  1 [facility]  04/04/2024  9:00 AM Knox Leita CROME, RD [facility]

## 2024-02-18 NOTE — Patient Instructions (Addendum)
-  Take Lantus  16 units daily  -Humalog  4 units with Lunch and Supper   HOW TO TREAT LOW BLOOD SUGARS (Blood sugar LESS THAN 70 MG/DL) Please follow the RULE OF 15 for the treatment of hypoglycemia treatment (when your (blood sugars are less than 70 mg/dL)   STEP 1: Take 15 grams of carbohydrates when your blood sugar is low, which includes:  3-4 GLUCOSE TABS  OR 3-4 OZ OF JUICE OR REGULAR SODA OR ONE TUBE OF GLUCOSE GEL    STEP 2: RECHECK blood sugar in 15 MINUTES STEP 3: If your blood sugar is still low at the 15 minute recheck --> then, go back to STEP 1 and treat AGAIN with another 15 grams of carbohydrates.

## 2024-02-19 ENCOUNTER — Ambulatory Visit: Payer: Self-pay | Admitting: Internal Medicine

## 2024-02-19 LAB — MICROALBUMIN / CREATININE URINE RATIO
Creatinine, Urine: 16 mg/dL — ABNORMAL LOW (ref 20–275)
Microalb Creat Ratio: 38 mg/g{creat} — ABNORMAL HIGH (ref <30)
Microalb, Ur: 0.6 mg/dL

## 2024-02-24 ENCOUNTER — Telehealth: Payer: Self-pay

## 2024-02-24 ENCOUNTER — Other Ambulatory Visit (HOSPITAL_COMMUNITY): Payer: Self-pay

## 2024-02-24 NOTE — Telephone Encounter (Signed)
 Pharmacy Patient Advocate Encounter   Received notification from Onbase that prior authorization for Dexcom G7 sensor is required/requested.   Insurance verification completed.   The patient is insured through [facility].   Per test claim: PA required; PA submitted to above mentioned insurance via Latent Key/confirmation #/EOC [id number] Status is pending

## 2024-02-29 NOTE — Telephone Encounter (Signed)
 Pharmacy Patient Advocate Encounter  Received notification from [employer] that Prior Authorization for dEXCOM g7 SENSOR has been CANCELLED due to  Prior authorization is not required at this time    PA #/Case ID/Reference #: [id number]

## 2024-04-04 ENCOUNTER — Encounter: Admitting: Dietician

## 2024-04-14 NOTE — Progress Notes (Unsigned)
" °  °  °  GYNECOLOGY OFFICE PROCEDURE NOTE   Wanda [name] is a 44 y.o. G1P0010 here for endometrial biopsy for AUB. Recent ultrasound also showed EL 12 mm..  Today, she reports no concerning symptoms. Of note, pap on 08/16/2021 was Normal pap, HPV negative.   ENDOMETRIAL BIOPSY     The indications for endometrial biopsy were reviewed.   Risks of the biopsy including cramping, bleeding, infection, uterine perforation, inadequate specimen and need for additional procedures were discussed. Offered alternative of hysteroscopy, dilation and curettage in OR. The patient states she understands the R/B/I/A and agrees to undergo procedure today.   Urine pregnancy test was {Blank single:19197::Negative,Not indicated}.   Consent was signed. Time out was performed.    Patient was positioned in dorsal lithotomy position. A vaginal speculum was placed.  The cervix was visualized and was prepped with Betadine.  A single-toothed tenaculum was placed on the anterior lip of the cervix to stabilize it. The 3 mm pipelle was easily introduced into the endometrial cavity without difficulty to a depth of *** cm, and a {Blank single:19197::Scant,Moderate} amount of tissue was obtained after *** passes and sent to pathology. The instruments were removed from the patient's vagina. Minimal bleeding from the cervix was noted. The patient tolerated the procedure well.   Patient was given post procedure instructions.  Will follow up pathology and manage accordingly; patient will be contacted with results and recommendations.  Routine preventative health maintenance measures emphasized.       Vina Solian, MD, FACOG Obstetrician & Gynecologist, [facility] for [facility], [facility]  "

## 2024-04-18 ENCOUNTER — Other Ambulatory Visit: Admitting: Obstetrics and Gynecology

## 2024-04-18 DIAGNOSIS — N951 Menopausal and female climacteric states: Secondary | ICD-10-CM

## 2024-04-18 DIAGNOSIS — N926 Irregular menstruation, unspecified: Secondary | ICD-10-CM

## 2024-05-30 ENCOUNTER — Ambulatory Visit: Admitting: Internal Medicine
